# Patient Record
Sex: Male | Born: 1955 | Race: White | Hispanic: No | Marital: Married | Smoking: Current every day smoker
Health system: Southern US, Community
[De-identification: ages and names within clinical notes are randomized; demographics above are authoritative.]

## PROBLEM LIST (undated history)

## (undated) DIAGNOSIS — K219 Gastro-esophageal reflux disease without esophagitis: Secondary | ICD-10-CM

## (undated) DIAGNOSIS — M1990 Unspecified osteoarthritis, unspecified site: Secondary | ICD-10-CM

## (undated) DIAGNOSIS — J449 Chronic obstructive pulmonary disease, unspecified: Secondary | ICD-10-CM

## (undated) DIAGNOSIS — G4733 Obstructive sleep apnea (adult) (pediatric): Secondary | ICD-10-CM

## (undated) DIAGNOSIS — F329 Major depressive disorder, single episode, unspecified: Secondary | ICD-10-CM

## (undated) DIAGNOSIS — I2510 Atherosclerotic heart disease of native coronary artery without angina pectoris: Secondary | ICD-10-CM

## (undated) DIAGNOSIS — M51369 Other intervertebral disc degeneration, lumbar region without mention of lumbar back pain or lower extremity pain: Secondary | ICD-10-CM

## (undated) DIAGNOSIS — M5136 Other intervertebral disc degeneration, lumbar region: Secondary | ICD-10-CM

## (undated) DIAGNOSIS — K635 Polyp of colon: Secondary | ICD-10-CM

## (undated) DIAGNOSIS — I10 Essential (primary) hypertension: Secondary | ICD-10-CM

## (undated) DIAGNOSIS — E785 Hyperlipidemia, unspecified: Secondary | ICD-10-CM

## (undated) DIAGNOSIS — F32A Depression, unspecified: Secondary | ICD-10-CM

## (undated) HISTORY — DX: Depression, unspecified: F32.A

## (undated) HISTORY — DX: Gastro-esophageal reflux disease without esophagitis: K21.9

## (undated) HISTORY — DX: Other intervertebral disc degeneration, lumbar region without mention of lumbar back pain or lower extremity pain: M51.369

## (undated) HISTORY — DX: Chronic obstructive pulmonary disease, unspecified: J44.9

## (undated) HISTORY — DX: Hyperlipidemia, unspecified: E78.5

## (undated) HISTORY — DX: Major depressive disorder, single episode, unspecified: F32.9

## (undated) HISTORY — DX: Other intervertebral disc degeneration, lumbar region: M51.36

## (undated) HISTORY — DX: Atherosclerotic heart disease of native coronary artery without angina pectoris: I25.10

## (undated) HISTORY — DX: Polyp of colon: K63.5

## (undated) HISTORY — DX: Essential (primary) hypertension: I10

## (undated) HISTORY — DX: Obstructive sleep apnea (adult) (pediatric): G47.33

## (undated) HISTORY — DX: Unspecified osteoarthritis, unspecified site: M19.90

---

## 1998-03-12 ENCOUNTER — Ambulatory Visit (HOSPITAL_COMMUNITY): Admission: RE | Admit: 1998-03-12 | Discharge: 1998-03-12 | Payer: Self-pay | Admitting: Neurosurgery

## 2001-04-17 ENCOUNTER — Encounter: Admission: RE | Admit: 2001-04-17 | Discharge: 2001-04-17 | Payer: Self-pay

## 2001-05-26 ENCOUNTER — Encounter: Admission: RE | Admit: 2001-05-26 | Discharge: 2001-06-12 | Payer: Self-pay | Admitting: Neurosurgery

## 2003-04-22 ENCOUNTER — Encounter: Payer: Self-pay | Admitting: Cardiology

## 2003-04-22 ENCOUNTER — Encounter: Admission: RE | Admit: 2003-04-22 | Discharge: 2003-04-22 | Payer: Self-pay | Admitting: Cardiology

## 2003-04-27 ENCOUNTER — Ambulatory Visit (HOSPITAL_COMMUNITY): Admission: RE | Admit: 2003-04-27 | Discharge: 2003-04-28 | Payer: Self-pay | Admitting: Cardiology

## 2003-06-19 ENCOUNTER — Inpatient Hospital Stay (HOSPITAL_COMMUNITY): Admission: EM | Admit: 2003-06-19 | Discharge: 2003-06-21 | Payer: Self-pay

## 2003-06-19 ENCOUNTER — Encounter: Payer: Self-pay | Admitting: *Deleted

## 2003-06-20 ENCOUNTER — Encounter: Payer: Self-pay | Admitting: Cardiology

## 2004-04-05 ENCOUNTER — Ambulatory Visit (HOSPITAL_COMMUNITY): Admission: RE | Admit: 2004-04-05 | Discharge: 2004-04-06 | Payer: Self-pay | Admitting: Orthopedic Surgery

## 2004-09-28 ENCOUNTER — Encounter: Admission: RE | Admit: 2004-09-28 | Discharge: 2004-09-28 | Payer: Self-pay | Admitting: Family Medicine

## 2005-04-18 ENCOUNTER — Ambulatory Visit (HOSPITAL_COMMUNITY): Admission: RE | Admit: 2005-04-18 | Discharge: 2005-04-19 | Payer: Self-pay | Admitting: Orthopedic Surgery

## 2006-05-26 IMAGING — CR DG ABDOMEN ACUTE W/ 1V CHEST
4 series · 4 of 4 positions shown · non-contrast
Comparison: none

CLINICAL DATA: Low abdominal pain, constipation.
DIAGNOSTIC ABDOMEN, ACUTE, WITH CHEST ? 09/28/04:
The bowel gas pattern is normal. There is no evidence of free air, radiopaque calculi or other significant abnormality.  Slight scoliosis, convex to the right, is seen at the superior lumbar spine.  Benign appearing 3 mm prominent pulmonary vessel on end or tiny calcified granuloma seen at the right upper lobe. 

CHEST (ONE VIEW):
The heart size and mediastinal structures are normal. The lungs are clear.

[view not recorded (1 of 4)]
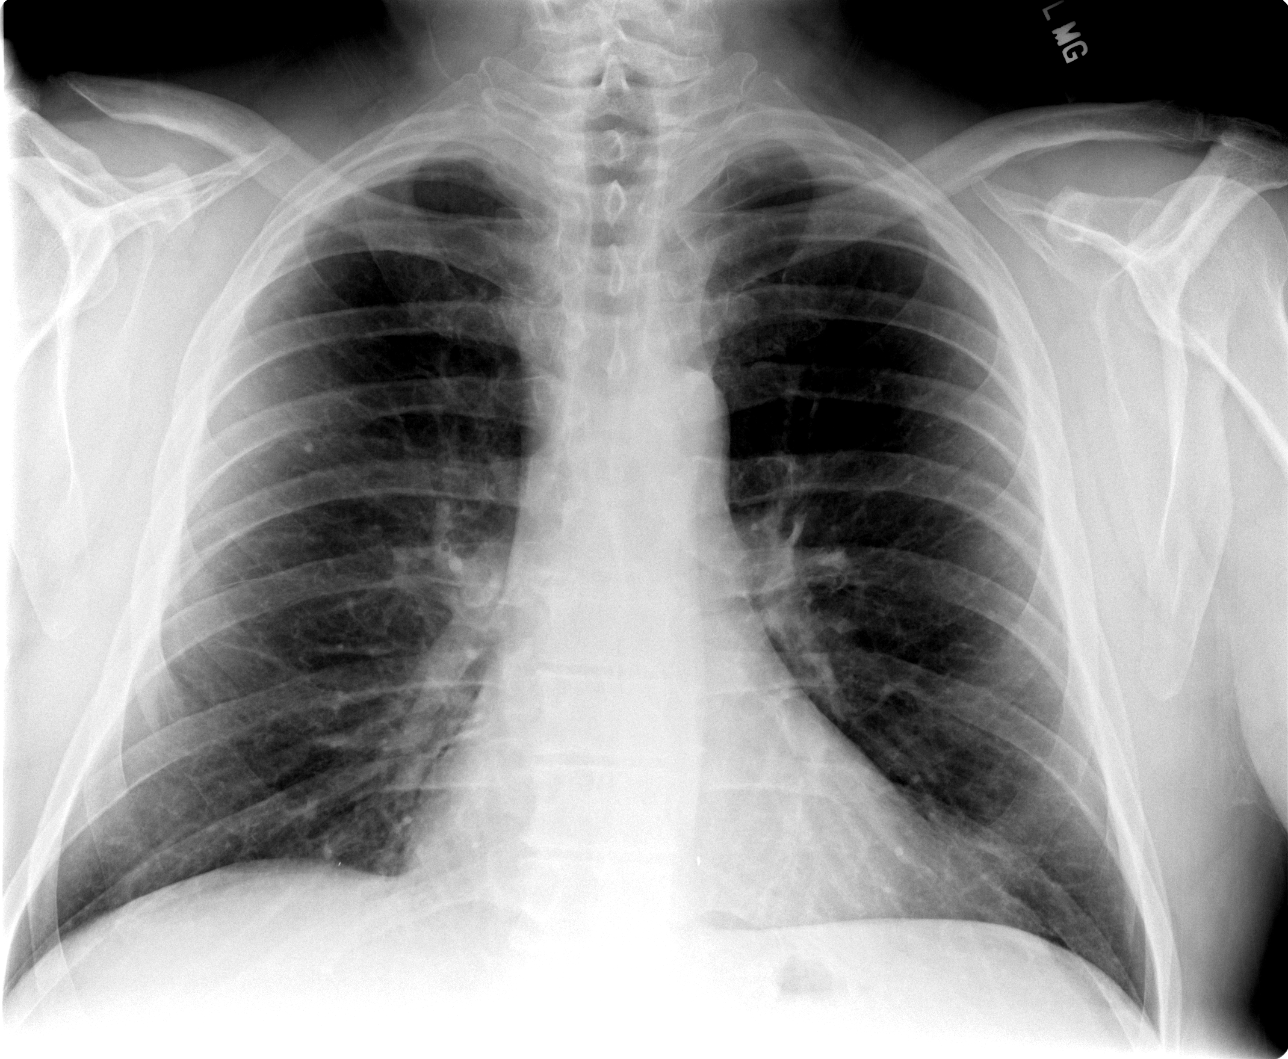

[view not recorded (2 of 4)]
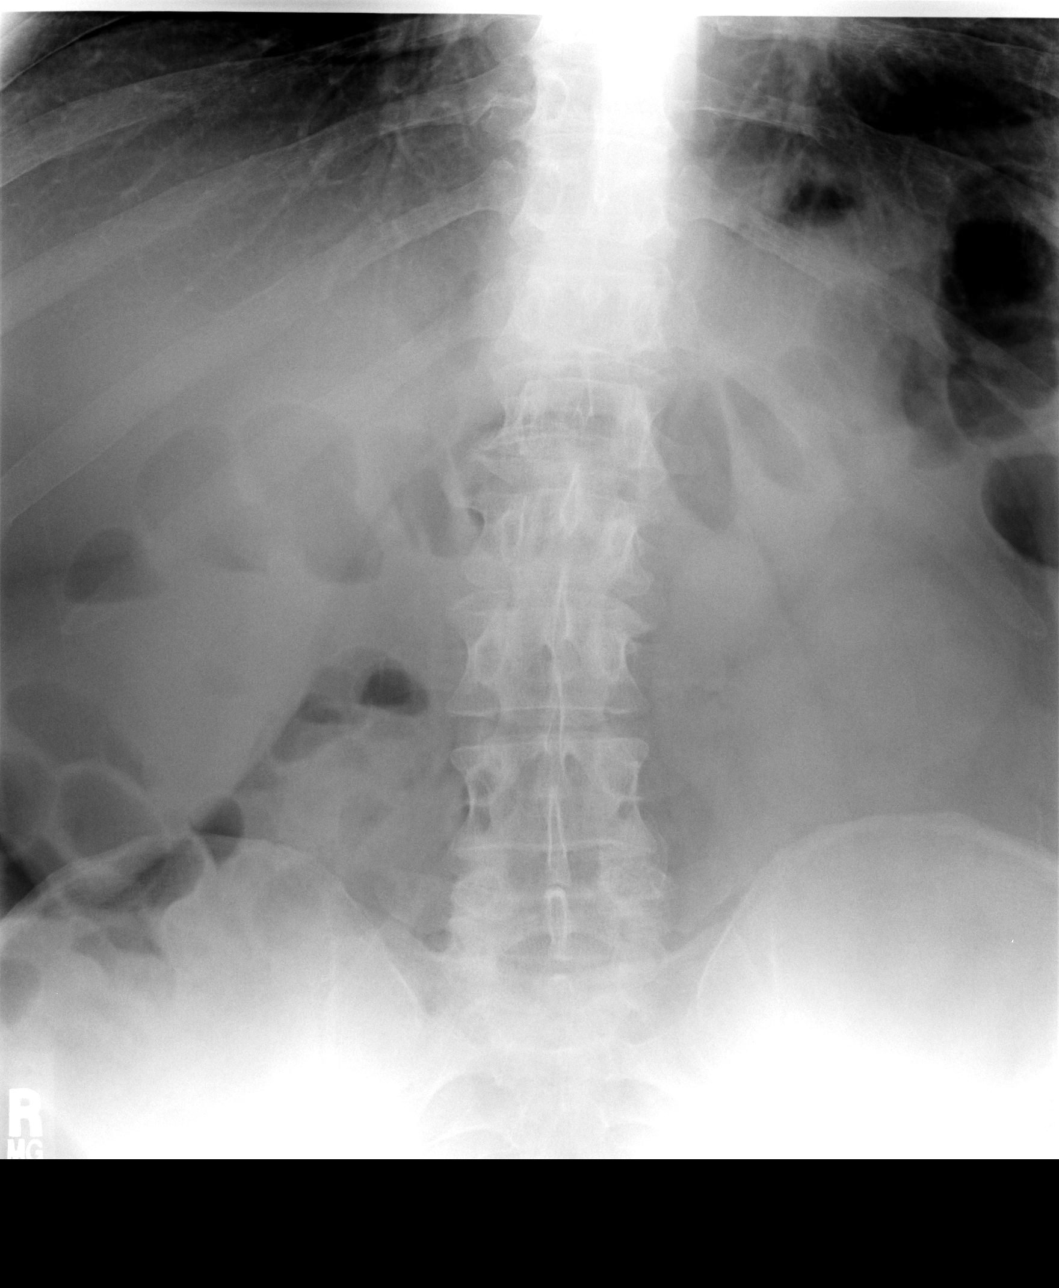

[view not recorded (3 of 4)]
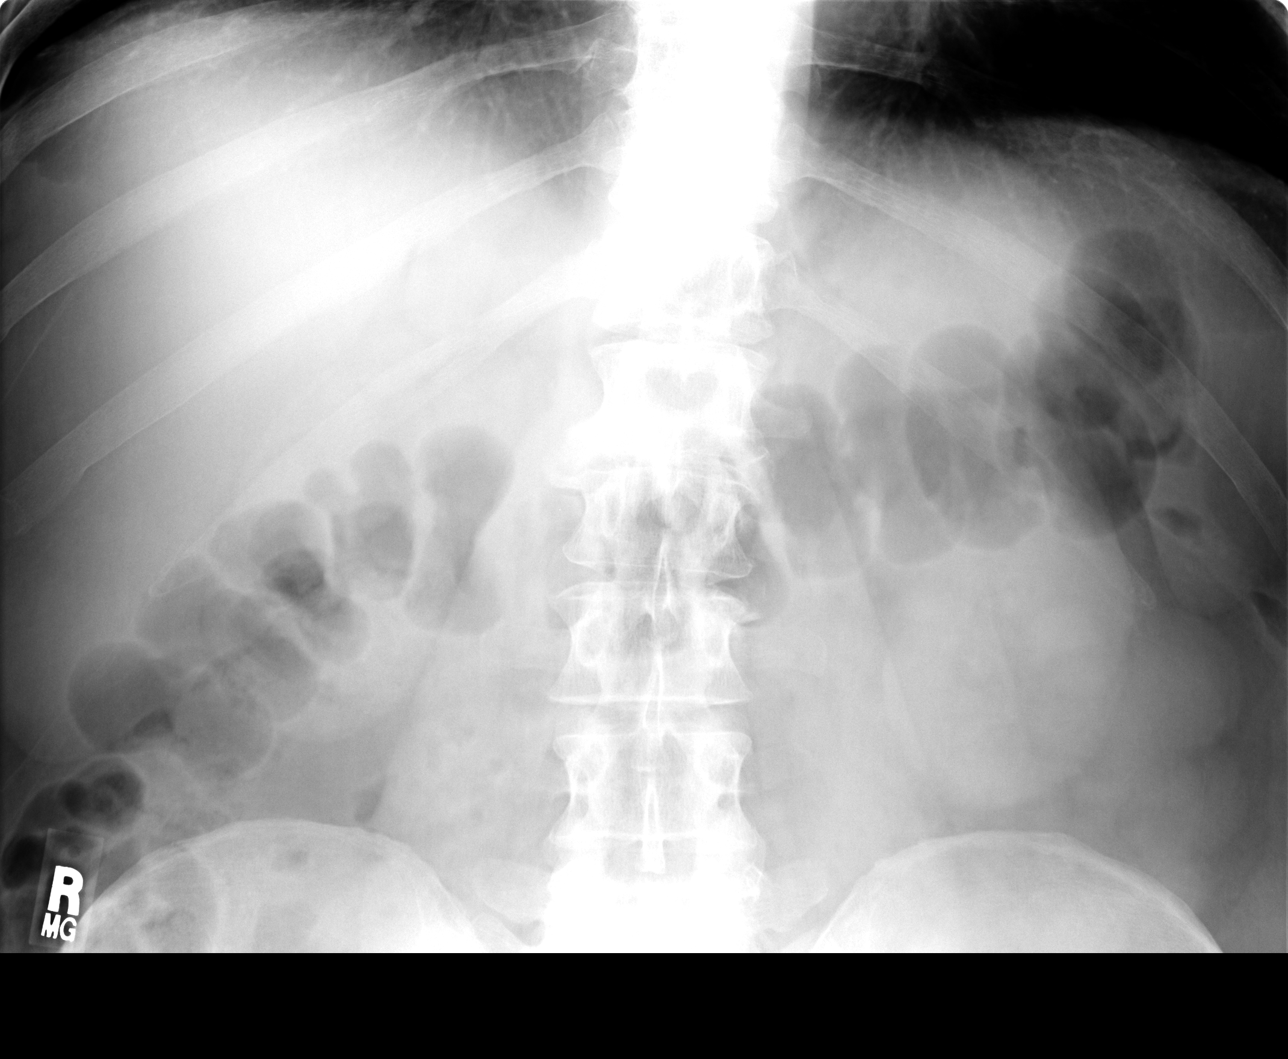

[view not recorded (4 of 4)]
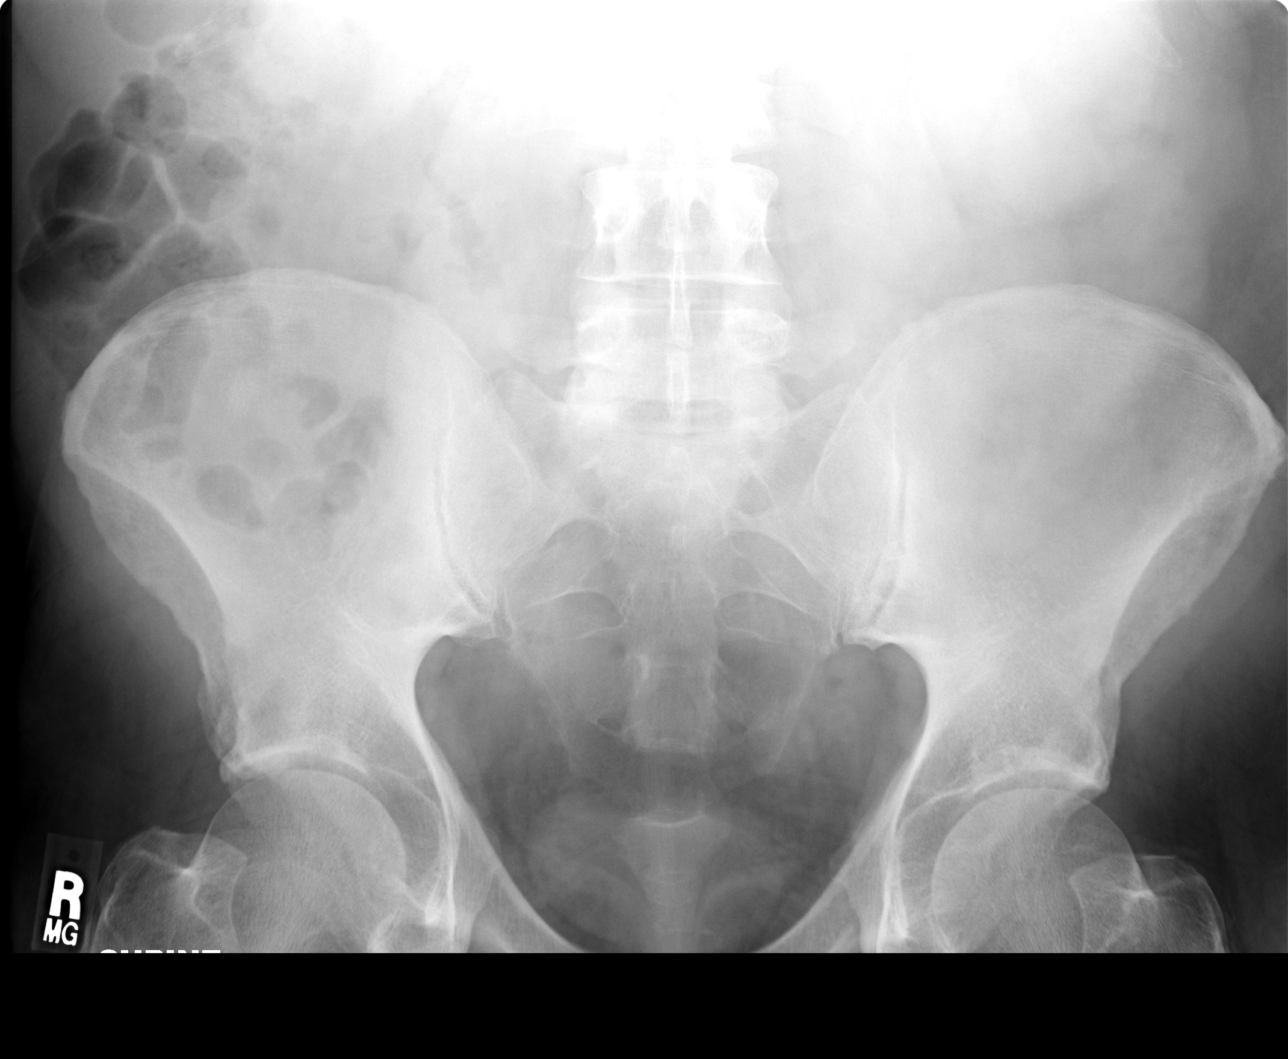

[4 of 4 positions shown; findings below may reference images not displayed]

IMPRESSION: Negative abdominal radiographs. No active cardiopulmonary disease.

## 2007-11-12 ENCOUNTER — Encounter: Payer: Self-pay | Admitting: Pulmonary Disease

## 2007-12-29 ENCOUNTER — Encounter: Payer: Self-pay | Admitting: Pulmonary Disease

## 2008-09-20 ENCOUNTER — Ambulatory Visit (HOSPITAL_COMMUNITY): Admission: RE | Admit: 2008-09-20 | Discharge: 2008-09-20 | Payer: Self-pay | Admitting: General Surgery

## 2010-05-16 ENCOUNTER — Ambulatory Visit: Payer: Self-pay | Admitting: Family Medicine

## 2010-05-16 DIAGNOSIS — E785 Hyperlipidemia, unspecified: Secondary | ICD-10-CM

## 2010-05-16 DIAGNOSIS — F329 Major depressive disorder, single episode, unspecified: Secondary | ICD-10-CM

## 2010-05-16 DIAGNOSIS — G4733 Obstructive sleep apnea (adult) (pediatric): Secondary | ICD-10-CM

## 2010-05-16 DIAGNOSIS — I10 Essential (primary) hypertension: Secondary | ICD-10-CM | POA: Insufficient documentation

## 2010-05-16 DIAGNOSIS — I252 Old myocardial infarction: Secondary | ICD-10-CM

## 2010-06-23 ENCOUNTER — Ambulatory Visit: Payer: Self-pay | Admitting: Family Medicine

## 2010-06-23 ENCOUNTER — Encounter (INDEPENDENT_AMBULATORY_CARE_PROVIDER_SITE_OTHER): Payer: Self-pay | Admitting: *Deleted

## 2010-06-23 DIAGNOSIS — G4730 Sleep apnea, unspecified: Secondary | ICD-10-CM | POA: Insufficient documentation

## 2010-06-23 DIAGNOSIS — D239 Other benign neoplasm of skin, unspecified: Secondary | ICD-10-CM | POA: Insufficient documentation

## 2010-06-23 DIAGNOSIS — Z860100 Personal history of colon polyps, unspecified: Secondary | ICD-10-CM | POA: Insufficient documentation

## 2010-06-23 DIAGNOSIS — F17200 Nicotine dependence, unspecified, uncomplicated: Secondary | ICD-10-CM

## 2010-06-27 LAB — CONVERTED CEMR LAB
AST: 45 units/L — ABNORMAL HIGH (ref 0–37)
Albumin: 4.4 g/dL (ref 3.5–5.2)
Bilirubin, Direct: 0.1 mg/dL (ref 0.0–0.3)
Chloride: 100 meq/L (ref 96–112)
Cholesterol: 283 mg/dL — ABNORMAL HIGH (ref 0–200)
Direct LDL: 147.7 mg/dL
Potassium: 4.4 meq/L (ref 3.5–5.1)
Sodium: 135 meq/L (ref 135–145)
Testosterone: 160.06 ng/dL — ABNORMAL LOW (ref 350.00–890.00)
Total CHOL/HDL Ratio: 7
Total Protein: 7.4 g/dL (ref 6.0–8.3)

## 2010-07-10 ENCOUNTER — Ambulatory Visit: Payer: Self-pay | Admitting: Family Medicine

## 2010-07-10 ENCOUNTER — Ambulatory Visit: Payer: Self-pay | Admitting: Pulmonary Disease

## 2010-07-10 ENCOUNTER — Telehealth (INDEPENDENT_AMBULATORY_CARE_PROVIDER_SITE_OTHER): Payer: Self-pay | Admitting: *Deleted

## 2010-07-10 DIAGNOSIS — E348 Other specified endocrine disorders: Secondary | ICD-10-CM | POA: Insufficient documentation

## 2010-07-11 LAB — CONVERTED CEMR LAB
ALT: 20 units/L (ref 0–53)
Albumin: 4.4 g/dL (ref 3.5–5.2)
Alkaline Phosphatase: 57 units/L (ref 39–117)
Bilirubin, Direct: 0.2 mg/dL (ref 0.0–0.3)
Eosinophils Relative: 1.7 % (ref 0.0–5.0)
HCT: 45.1 % (ref 39.0–52.0)
Hemoglobin: 15.6 g/dL (ref 13.0–17.0)
MCHC: 34.6 g/dL (ref 30.0–36.0)
Neutrophils Relative %: 60.5 % (ref 43.0–77.0)
RDW: 14.6 % (ref 11.5–14.6)
Testosterone: 315.15 ng/dL — ABNORMAL LOW (ref 350.00–890.00)

## 2010-07-17 ENCOUNTER — Encounter (INDEPENDENT_AMBULATORY_CARE_PROVIDER_SITE_OTHER): Payer: Self-pay | Admitting: *Deleted

## 2010-07-19 ENCOUNTER — Encounter: Payer: Self-pay | Admitting: Family Medicine

## 2010-07-20 ENCOUNTER — Ambulatory Visit: Payer: Self-pay | Admitting: Gastroenterology

## 2010-07-27 ENCOUNTER — Ambulatory Visit: Payer: Self-pay | Admitting: Cardiovascular Disease

## 2010-07-27 DIAGNOSIS — R0602 Shortness of breath: Secondary | ICD-10-CM | POA: Insufficient documentation

## 2010-08-07 ENCOUNTER — Telehealth: Payer: Self-pay | Admitting: Pulmonary Disease

## 2010-08-09 ENCOUNTER — Telehealth (INDEPENDENT_AMBULATORY_CARE_PROVIDER_SITE_OTHER): Payer: Self-pay | Admitting: *Deleted

## 2010-08-10 ENCOUNTER — Encounter (HOSPITAL_COMMUNITY): Admission: RE | Admit: 2010-08-10 | Discharge: 2010-08-15 | Payer: Self-pay | Admitting: Cardiovascular Disease

## 2010-08-10 ENCOUNTER — Encounter: Payer: Self-pay | Admitting: Internal Medicine

## 2010-08-10 ENCOUNTER — Ambulatory Visit: Payer: Self-pay

## 2010-08-10 ENCOUNTER — Ambulatory Visit: Payer: Self-pay | Admitting: Internal Medicine

## 2010-08-11 ENCOUNTER — Telehealth (INDEPENDENT_AMBULATORY_CARE_PROVIDER_SITE_OTHER): Payer: Self-pay | Admitting: *Deleted

## 2010-08-15 ENCOUNTER — Telehealth: Payer: Self-pay | Admitting: Family Medicine

## 2010-08-21 ENCOUNTER — Telehealth: Payer: Self-pay | Admitting: Family Medicine

## 2010-08-21 DIAGNOSIS — N39498 Other specified urinary incontinence: Secondary | ICD-10-CM | POA: Insufficient documentation

## 2010-08-25 ENCOUNTER — Encounter: Payer: Self-pay | Admitting: Family Medicine

## 2010-09-07 ENCOUNTER — Telehealth: Payer: Self-pay | Admitting: Cardiovascular Disease

## 2010-09-29 ENCOUNTER — Encounter (INDEPENDENT_AMBULATORY_CARE_PROVIDER_SITE_OTHER): Payer: Self-pay | Admitting: *Deleted

## 2010-10-03 ENCOUNTER — Ambulatory Visit: Payer: Self-pay | Admitting: Family Medicine

## 2010-10-03 LAB — CONVERTED CEMR LAB
ALT: 30 units/L (ref 0–53)
AST: 32 units/L (ref 0–37)
Albumin: 4.1 g/dL (ref 3.5–5.2)
Alkaline Phosphatase: 61 units/L (ref 39–117)
Bilirubin, Direct: 0.1 mg/dL (ref 0.0–0.3)
Cholesterol: 231 mg/dL — ABNORMAL HIGH (ref 0–200)
Direct LDL: 142.4 mg/dL
Total Bilirubin: 0.5 mg/dL (ref 0.3–1.2)
Total CHOL/HDL Ratio: 7
Total Protein: 7 g/dL (ref 6.0–8.3)
VLDL: 93.8 mg/dL — ABNORMAL HIGH (ref 0.0–40.0)

## 2010-10-05 ENCOUNTER — Ambulatory Visit: Payer: Self-pay | Admitting: Family Medicine

## 2010-10-05 DIAGNOSIS — R05 Cough: Secondary | ICD-10-CM

## 2010-10-25 ENCOUNTER — Telehealth: Payer: Self-pay | Admitting: Pulmonary Disease

## 2010-10-30 ENCOUNTER — Ambulatory Visit: Payer: Self-pay | Admitting: Family Medicine

## 2010-10-30 DIAGNOSIS — E291 Testicular hypofunction: Secondary | ICD-10-CM

## 2010-10-31 LAB — CONVERTED CEMR LAB
ALT: 42 units/L (ref 0–53)
BUN: 13 mg/dL (ref 6–23)
Basophils Absolute: 0.1 10*3/uL (ref 0.0–0.1)
Calcium: 9.4 mg/dL (ref 8.4–10.5)
Chloride: 102 meq/L (ref 96–112)
Cholesterol: 221 mg/dL — ABNORMAL HIGH (ref 0–200)
Eosinophils Relative: 3 % (ref 0.0–5.0)
GFR calc non Af Amer: 90.17 mL/min (ref >60.00)
Hemoglobin: 14.6 g/dL (ref 13.0–17.0)
Lymphocytes Relative: 21.1 % (ref 12.0–46.0)
Lymphs Abs: 2 10*3/uL (ref 0.7–4.0)
Monocytes Absolute: 0.7 10*3/uL (ref 0.1–1.0)
Neutro Abs: 6.2 10*3/uL (ref 1.4–7.7)
Sodium: 138 meq/L (ref 135–145)
Total Bilirubin: 0.6 mg/dL (ref 0.3–1.2)
Total Protein: 6.9 g/dL (ref 6.0–8.3)
Triglycerides: 532 mg/dL — ABNORMAL HIGH (ref 0.0–149.0)
WBC: 9.3 10*3/uL (ref 4.5–10.5)

## 2010-11-01 ENCOUNTER — Encounter: Payer: Self-pay | Admitting: Pulmonary Disease

## 2010-11-03 ENCOUNTER — Telehealth: Payer: Self-pay | Admitting: Family Medicine

## 2010-11-07 ENCOUNTER — Ambulatory Visit: Payer: Self-pay | Admitting: Gastroenterology

## 2010-11-07 DIAGNOSIS — I2510 Atherosclerotic heart disease of native coronary artery without angina pectoris: Secondary | ICD-10-CM | POA: Insufficient documentation

## 2010-11-08 ENCOUNTER — Encounter: Payer: Self-pay | Admitting: Pulmonary Disease

## 2010-11-10 ENCOUNTER — Telehealth: Payer: Self-pay | Admitting: Cardiovascular Disease

## 2010-11-13 ENCOUNTER — Telehealth: Payer: Self-pay | Admitting: Gastroenterology

## 2010-11-13 ENCOUNTER — Telehealth: Payer: Self-pay | Admitting: Pulmonary Disease

## 2010-11-17 ENCOUNTER — Ambulatory Visit: Payer: Self-pay | Admitting: Gastroenterology

## 2010-11-22 ENCOUNTER — Encounter: Payer: Self-pay | Admitting: Gastroenterology

## 2010-12-05 ENCOUNTER — Telehealth: Payer: Self-pay | Admitting: Cardiovascular Disease

## 2010-12-21 ENCOUNTER — Ambulatory Visit
Admission: RE | Admit: 2010-12-21 | Discharge: 2010-12-21 | Payer: Self-pay | Source: Home / Self Care | Attending: Family Medicine | Admitting: Family Medicine

## 2010-12-21 DIAGNOSIS — M549 Dorsalgia, unspecified: Secondary | ICD-10-CM | POA: Insufficient documentation

## 2010-12-21 DIAGNOSIS — M5137 Other intervertebral disc degeneration, lumbosacral region: Secondary | ICD-10-CM | POA: Insufficient documentation

## 2010-12-26 ENCOUNTER — Ambulatory Visit: Admit: 2010-12-26 | Payer: Self-pay | Admitting: Family Medicine

## 2010-12-27 ENCOUNTER — Telehealth (INDEPENDENT_AMBULATORY_CARE_PROVIDER_SITE_OTHER): Payer: Self-pay | Admitting: *Deleted

## 2010-12-28 ENCOUNTER — Telehealth: Payer: Self-pay | Admitting: Family Medicine

## 2010-12-28 ENCOUNTER — Telehealth: Payer: Self-pay | Admitting: Gastroenterology

## 2010-12-28 NOTE — Miscellaneous (Signed)
  Clinical Lists Changes Received download from autocpap....good compliance, optimal pressure13-14cm, but had a fairly large leak will see if pt is having mask leak issues, and also discuss comfort of bipap vs cpap with him.    Appended Document:  called pt and got answering machine.  Will call back again tomorrow.

## 2010-12-28 NOTE — Progress Notes (Signed)
Summary: cpap issues  Phone Note Call from Patient Call back at [phone]   Caller: Spouse//candace Call For: clance Summary of Call: States that [facility] brought a cpap machine to her husband last week and it's not working for him, says he can't breathe with it and the machine makes a terrible noise, pls advise. Initial call taken by: Darletta Moll,  August 07, 2010 1:19 PM  Follow-up for Phone Call        pcc, please check into this for me.  He was supposed to get a NEW machine set on auto.  did this occur? Follow-up by: Barbaraann Share MD,  August 07, 2010 4:46 PM  Additional Follow-up for Phone Call Additional follow up Details #1::        spoke to beth @[facility]  she will speak to the wife on tues am and get pt a loaner cpap because he is not eligable for the new machine yet  Additional Follow-up by: Oneita Jolly,  August 07, 2010 4:57 PM

## 2010-12-28 NOTE — Progress Notes (Signed)
Summary: wants thyroid checked  Phone Note Call from Patient   Caller: Significant Other  [name] Summary of Call: Pt's girlfriend is asking if pt can have tsh added to his lab work in january.  She says he is tired all the time and his trigs are way high, and she thinks it might be because his thyroid is off. Initial call taken by: Lowella Petties CMA, AAMA,  November 03, 2010 12:52 PM  Follow-up for Phone Call        yes that is fine. Ruthe Mannan MD  November 03, 2010 2:21 PM   Additional Follow-up for Phone Call Additional follow up Details #1::        TSH added to labs.  Additional Follow-up by: Lowella Petties CMA, AAMA,  November 03, 2010 2:31 PM

## 2010-12-28 NOTE — Progress Notes (Signed)
Summary: possible switch from [facility] to [facility]  Phone Note Call from Patient Call back at [phone]   Caller: Spouse-[name] Call For: Clance Reason for Call: Talk to Nurse Summary of Call: Pt is not happy with [facility].  He wants to switch to [facility] on [facility].  They([facility]) brought him a loner for his cpap machine but no mask or anything to go with it.  [facility], so he would like to switch Initial call taken by: Eugene Gavia,  August 11, 2010 9:53 AM  Follow-up for Phone Call        called spoke with patient's wife, Sonny Masters, who states [facility] will be by their house this afternoon between 1 and 3pm and is supposed to be bringing pt a new mask and hose, as he has been using the same ones since 2007.  [name] states that they will "wait and see" what happens this afternoon with [facility] before making the final decision about whether or not to switch DME's.  asked [name] to keep up informed; she stated that she will.  will sign off on message and await a call back. Follow-up by: Boone Master CNA/MA,  August 11, 2010 11:03 AM

## 2010-12-28 NOTE — Assessment & Plan Note (Signed)
Summary: np3/cad/smoker/jml   Referring Provider:  Ruthe Mannan Primary Provider:  Ruthe Mannan MD  CC:  pt didnot bring meds pt states our  list should be up to date...referal from Dr. Dayton Martes.  History of Present Illness: Overweight smoker with CAD.  Previously followed by Dr Clarene Duke.  History of stent to OM.  Last cath a year ago in [place].  No records but apparantly stent patent and had "collateral " flow to another vessel with medical Rx recommended.  He smokes 2-3 ppd.  Sees Dr Shelle Iron for COPD.  Had nightmares with Chantix.  Counseled for less than 10 minutes about smoking cessation.  Will try to get him into program with Dr Dellia Cloud.  Ok to use nicotine patch if myovue normal.  Denies "heat" which was previous antinal equivalent but scared to exercise.  Has lots of exertional dyspnea.  No cough or sputum.  Chronic bronchitic voice.  Compliant with meds.  May benefit from Plavix given ongoing smoking.    Current Problems (verified): 1)  Shortness of Breath  (ICD-786.05) 2)  Hypertension  (ICD-401.9) 3)  Hyperlipidemia  (ICD-272.4) 4)  Myocardial Infarction, Hx of  (ICD-412) 5)  Other Specified Endocrine Disorders  (ICD-259.8) 6)  Tobacco Abuse  (ICD-305.1) 7)  Colonic Polyps, Hx of  (ICD-V12.72) 8)  Nevus  (ICD-216.9) 9)  Sleep Apnea  (ICD-780.57) 10)  Obstructive Sleep Apnea  (ICD-327.23) 11)  Depression  (ICD-311)  Current Medications (verified): 1)  Amlodipine Besylate 5 Mg Tabs (Amlodipine Besylate) .... Take One Tablet By Mouth Daily 2)  Plavix 75 Mg Tabs (Clopidogrel Bisulfate) .... Take One Tablet By Mouth Daily 3)  Ranexa 500 Mg Xr12h-Tab (Ranolazine) .Marland Kitchen.. 1 By Mouth Two Times A Day 4)  Lexapro 20 Mg Tabs (Escitalopram Oxalate) .... Take One Tablet By Mouth Daily 5)  Aspirin 325 Mg Tabs (Aspirin) .... Take One Tablet By Mouth Daily 6)  Fexofenadine Hcl 180 Mg Tabs (Fexofenadine Hcl) .... Take One Tablet By Mouth Daily 7)  Metoprolol Tartrate 25 Mg Tabs (Metoprolol Tartrate)  .... Take One Tablet By Mouth Two Times A Day 8)  Simcor 500-40 Mg Xr24h-Tab (Niacin-Simvastatin) .... Take One Tablet By Mouth Daily 9)  Zyban 150 Mg Xr12h-Tab (Bupropion Hcl (Smoking Deter)) .Marland Kitchen.. 1 Tab Po Daily X 3 Days; Increase To 1 Tab Po Twice Daily; Treatment Should Continue For 7-12 Weeks. 10)  Tricor 145 Mg Tabs (Fenofibrate) .Marland Kitchen.. 1 Tab By Mouth Daily. 11)  Androgel Pump 1 % Gel (Testosterone) .... 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  started at age 46. Alcohol use-yes 10 drinks/week Drug use-no Regular exercise-yes pt is single. pt has children.  Review of Systems       Denies fever, malais, weight loss, blurry vision, decreased visual acuity, cough, sputum, hemoptysis, pleuritic pain, palpitaitons, heartburn, abdominal pain, melena, lower extremity edema, claudication, or rash.   Vital Signs:  Patient profile:   55 year old male Height:      73 inches Weight:  301 pounds BMI:     39.86 Pulse rate:   68 / minute Resp:     14 per minute BP sitting:   137 / 85  (left arm)  Vitals Entered By: Kem Parkinson (July 27, 2010 1:36 PM)  Physical Exam  General:  Affect appropriate Healthy:  appears stated age HEENT: normal Neck supple with no adenopathy JVP normal no bruits no thyromegaly Lungs clear with no wheezing and good diaphragmatic motion Heart:  S1/S2 no murmur,rub, gallop  or click PMI normal Abdomen: benighn, BS positve, no tenderness, no AAA no bruit.  No HSM or HJR Distal pulses intact with no bruits No edema Neuro non-focal Skin warm and dry    Impression & Recommendations:  Problem # 1:  SHORTNESS OF BREATH (ICD-786.05) F/U Dr Shelle Iron.  R/O coronary componant with stress myovue.  Check Oxygen sats with exercise  Continue inhalers His updated medication list for this problem includes:    Amlodipine Besylate 5 Mg Tabs (Amlodipine besylate) .Marland Kitchen... Take one tablet by mouth daily    Aspirin 325 Mg Tabs (Aspirin) .Marland Kitchen... Take one tablet by mouth daily    Metoprolol Tartrate 25 Mg Tabs (Metoprolol tartrate) .Marland Kitchen... Take one tablet by mouth two times a day  Orders: Nuclear Stress Test (Nuc Stress Test)  Problem # 2:  HYPERTENSION (ICD-401.9) Well controlled His updated medication list for this problem includes:    Amlodipine Besylate 5 Mg Tabs (Amlodipine besylate) .Marland Kitchen... Take one tablet by mouth daily    Aspirin 325 Mg Tabs (Aspirin) .Marland Kitchen... Take one tablet by mouth daily    Metoprolol Tartrate 25 Mg Tabs (Metoprolol tartrate) .Marland Kitchen... Take one tablet by mouth two times a day  Orders: Nuclear Stress Test (Nuc Stress Test)  Problem # 3:  HYPERLIPIDEMIA (ICD-272.4) Continue 3 drug Rx Refer to dietician.  Diet is poor.  Low carb [facility] type meal plan discussed His updated medication list for this problem includes:    Simcor 500-40 Mg Xr24h-tab (Niacin-simvastatin) .Marland Kitchen... Take one tablet by mouth daily    Tricor 145 Mg Tabs (Fenofibrate) .Marland Kitchen... 1 tab by mouth daily.  Orders: Nuclear Stress Test (Nuc Stress Test)  CHOL: 283 (06/23/2010)   HDL: 41.50 (06/23/2010)   TG: 592.0 (06/23/2010)  Problem # 4:  TOBACCO ABUSE (ICD-305.1) Counseled.  Will advice F/u with [facility] and nicotine replacement if myovue non ischemic.    Patient Instructions: 1)  Your physician recommends that you schedule a follow-up appointment in: 6 months 2)  You have been referred  to dr guttermqn--d/c smoking 3)  Your physician has requested that you have an exercise stress myoview.  For further information please visit [email or web address]  Please follow instruction sheet, as given.

## 2010-12-28 NOTE — Assessment & Plan Note (Signed)
Summary: Cardiology Nuclear Testing  Nuclear Med Background Indications for Stress Test: Evaluation for Ischemia, Stent Patency   History: Angioplasty, Asthma, COPD, Heart Catheterization, Myocardial Infarction, Myocardial Perfusion Study, Stents  History Comments: 04 UEA:VWUJ inferior ischemia,  EF=58%  Symptoms: Chest Pain, Chest Pain with Exertion, DOE, Rapid HR  Symptoms Comments: Last episode of CP:6 weeks ago.   Nuclear Pre-Procedure Cardiac Risk Factors: Hypertension, Lipids, Obesity, Smoker Caffeine/Decaff Intake: None NPO After: 7:00 PM Lungs: Clear.  O2 Sat 98% on RA. IV 0.9% NS with Angio Cath: 22g     IV Site: R Hand IV Started by: Bonnita Levan, RN Chest Size (in) 54     Height (in): 77 Weight (lb): 288 BMI: 34.28 Tech Comments: Metoprolol not held per MD  Nuclear Med Study 1 or 2 day study:  1 day     Stress Test Type:  Treadmill/Lexiscan Reading MD:  Arvilla Meres, MD     Referring MD:  Charlton Haws, MD Resting Radionuclide:  Technetium 41m Tetrofosmin     Resting Radionuclide Dose:  11.0 mCi  Stress Radionuclide:  Technetium 38m Tetrofosmin     Stress Radionuclide Dose:  33.0 mCi   Stress Protocol Exercise Time (min):  8:01 min     Max HR:  117 bpm     Predicted Max HR:  167 bpm  Max Systolic BP: 187 mm Hg     Percent Max HR:  70.06 %     METS: 7.9 Rate Pressure Product:  81191  Lexiscan: 0.4 mg   Stress Test Technologist:  [employer], CMA-N     Nuclear Technologist:  Domenic Polite, CNMT  Rest Procedure  Myocardial perfusion imaging was performed at rest 45 minutes following the intravenous administration of Technetium 55m Tetrofosmin.  Stress Procedure  Patient initially walked the treadmill utilizing the Bruce protocol for 7:04, but was unable to achieve his target heart rate due to significant dyspnea with an O2 Sat of 96% at peak exercise.  He then received IV Lexiscan 0.4 mg over 15-seconds and then Technetium 32m Tetrofosmin was injected at  30-seconds.  There were no significant ST-T wave changes with Lexiscan, only rare PAC's.  He did c/o his chest "burning" at peak exercise with dyspnea, that resolved immediately with rest.  Quantitative spect images were obtained after a 45 minute delay.  QPS Raw Data Images:  Extensive soft tissue attenuation. Stress Images:  There is decreased uptake in the inferior wall Rest Images:  There is decreased uptake in the inferior wall. Subtraction (SDS):  There is a fixed defect that is most consistent with a previous infarction. No significant ischemia. Transient Ischemic Dilatation:  .96  (Normal <1.22)  Lung/Heart Ratio:  .34  (Normal <0.45)  Quantitative Gated Spect Images QGS EDV:  150 ml QGS ESV:  68 ml QGS EF:  55 % QGS cine images:  Normal  Findings Low risk nuclear study      Overall Impression  Exercise Capacity: Fair exercise capacity. BP Response: Normal blood pressure response. Clinical Symptoms: Dyspnea and chest burning. Sats stayed 96% with exercise. ECG Impression: No significant ST segment change suggestive of ischemia. At submaximal HR ( 70% of age-predicted max) Overall Impression: Low risk stress nuclear study. Overall Impression Comments: There is a fixed defect in the inferior wall on perfusion imaging suggestive of previous infarct.  However, given normal wall motion and extensive soft-tissue artifact on raw images this may be artifactual. No ischemia.   Appended Document: Cardiology Nuclear Testing no ishemia good  EF  Continue medical Rx  Appended Document: Cardiology Nuclear Testing pt aware of results

## 2010-12-28 NOTE — Assessment & Plan Note (Signed)
Summary: MEDICATION REFILL/EVM   Vital Signs:  Patient Profile:   55 Years Old Male CC:      Medication renewal and Referral to Primary care. / rwt Height:     73.5 inches Weight:      295 pounds BMI:     38.53 O2 Sat:      97 % O2 treatment:    Room Air Temp:     98.3 degrees F oral Pulse rate:   70 / minute Pulse rhythm:   regular Resp:     18 per minute BP sitting:   134 / 78  (left arm)  Pt. in pain?   no  Vitals Entered By: Levonne Spiller EMT-P (May 16, 2010 1:36 PM)              Is Patient Diabetic? No Comments Pt. is a smoker. 2 packs per day.      Current Allergies: No known allergies History of Present Illness History from: patient Reason for visit: see chief complaint Chief Complaint: Medication renewal and Referral to Primary care. / rwt History of Present Illness: Patient reports that he has made an appointment to see a primary care physician but his appointment isn't until July. He needs a refill of Ranexa. The other meds he has refills of. He has a history of HTN, sleep apnea,Hypercholesterolism, CAD s/p MI and stent placement.   He reports that he needs another CPAP as he feels that his is broken. Unsure of his last sleep study.  REVIEW OF SYSTEMS Constitutional Symptoms       Complains of weight loss.     Denies fever, chills, night sweats, weight gain, and fatigue.      Comments: recently loss 20lbs on purpose Eyes       Denies change in vision, eye pain, eye discharge, glasses, contact lenses, and eye surgery. Ear/Nose/Throat/Mouth       Denies hearing loss/aids, change in hearing, ear pain, ear discharge, dizziness, frequent runny nose, frequent nose bleeds, sinus problems, sore throat, hoarseness, and tooth pain or bleeding.  Respiratory       Denies dry cough, productive cough, wheezing, shortness of breath, asthma, bronchitis, and emphysema/COPD.  Cardiovascular       Denies murmurs, chest pain, and tires easily with exhertion.     Gastrointestinal       Complains of indigestion.      Denies stomach pain, nausea/vomiting, diarrhea, constipation, and blood in bowel movements. Genitourniary       Denies painful urination, kidney stones, and loss of urinary control. Neurological       Denies paralysis, seizures, and fainting/blackouts. Musculoskeletal       Denies muscle pain, joint pain, joint stiffness, decreased range of motion, redness, swelling, muscle weakness, and gout.  Skin       Denies bruising, unusual mles/lumps or sores, and hair/skin or nail changes.  Psych       Denies mood changes, temper/anger issues, anxiety/stress, speech problems, depression, and sleep problems.  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: 2 ppd x 38 yrs Alcohol use-yes 10 drinks/week Drug use-no Regular exercise-yes Smoking Status:  current Drug Use:  no Does Patient Exercise:  yes Physical Exam General appearance: obese, well developed, well nourished, no acute distress Chest/Lungs: no rales, wheezes, or rhonchi bilateral, breath sounds  equal without effort Heart: regular rate and  rhythm, no murmur Extremities: normal extremities - no edema MSE: oriented to time, place, and person Assessment New Problems: OBSTRUCTIVE SLEEP APNEA (ICD-327.23) MYOCARDIAL INFARCTION, HX OF (ICD-412) HYPERTENSION (ICD-401.9) HYPERLIPIDEMIA (ICD-272.4) DEPRESSION (ICD-311)   Plan New Medications/Changes: RANEXA 500 MG XR12H-TAB (RANOLAZINE) 1 by mouth two times a day  #60 x 1, 05/16/2010, Tacey Ruiz MD  New Orders: New Patient Level II [phone] Sleep Study [Sleep Study]  The patient and/or caregiver has been counseled thoroughly with regard to medications prescribed including dosage, schedule,  interactions, rationale for use, and possible side effects and they verbalize understanding.  Diagnoses and expected course of recovery discussed and will return if not improved as expected or if the condition worsens. Patient and/or caregiver verbalized understanding.  Prescriptions: RANEXA 500 MG XR12H-TAB (RANOLAZINE) 1 by mouth two times a day  #60 x 1   Entered and Authorized by:   Tacey Ruiz MD   Signed by:   Tacey Ruiz MD on 05/16/2010   Method used:   Electronically to        [facility] (retail)       [address], [address]       [place], [place]  [address]       Ph: [phone]       Fax: [phone]   RxID:   [id number]   Orders Added: 1)  New Patient Level II [99202] 2)  Sleep Study [Sleep Study]  The risks, benefits and possible side effects of the treatments and tests were explained clearly to the patient and the patient verbalized understanding.  The patient was informed that there is no on-call provider or services available at this clinic during off-hours (when the clinic is closed).  If the patient developed a problem or concern that required immediate attention, the patient was advised to go the the nearest available urgent care or emergency department for medical care.  The patient verbalized understanding.

## 2010-12-28 NOTE — Progress Notes (Signed)
Summary: CPAP w/ download ordered thru [facility]  Phone Note Call from Patient   Caller: Patient Call For: clance Summary of Call: need to speak to nurse about v pap machine. Initial call taken by: Rickard Patience,  July 10, 2010 3:01 PM  Follow-up for Phone Call        [facility] pt's home number.  Number did not ring, went directly to voicemail - [facility] Gweneth Dimitri RN  July 10, 2010 3:14 PM  Spoke with pt.  Pt states he has a V pap auto 25 [employer].  It is thru [facility].  He would like to know if [facility] has received his sleep study yet.  Will forward message to Dr. Shelle Iron.  Pls advise. Thanks! Follow-up by: Gweneth Dimitri RN,  July 12, 2010 12:14 PM  Additional Follow-up for Phone Call Additional follow up Details #1::        let pt know that I will send order to [facility].  Will start him on automatic mode for 2 weeks to re-optimize his pressure.  I will call him with results, then can set on his optimal pressure. Additional Follow-up by: Barbaraann Share MD,  July 12, 2010 5:04 PM    Additional Follow-up for Phone Call Additional follow up Details #2::    called spoke with patient, advised of KC's recs as stated above.  pt verbalized his understanding. Boone Master CNA/MA  July 12, 2010 5:20 PM

## 2010-12-28 NOTE — Progress Notes (Signed)
Summary: coumadin instructions  Phone Note Call from Patient   Caller: [phone] [name] Call For: Dr Russella Dar Summary of Call: Needs to know about stopping his coumadin. Initial call taken by: Leanor Kail [facility],  November 13, 2010 9:57 AM  Follow-up for Phone Call        Spoke with patients Domestic Partner and told her that patient can stop Plavix 5 days before his procedure. [name] verbalized understanding and will notify patient to stop today for Colonoscopy . Follow-up by: Christie Nottingham CMA Duncan Dull),  November 13, 2010 10:24 AM

## 2010-12-28 NOTE — Letter (Signed)
Summary: Anticoagulation Modification Letter  [facility]  [address] [place], [place] [address]   Phone: [phone]  Fax: [phone]    November 07, 2010  Re:    Bobby Pena DOB:    1955-12-28 MRN:    [id number]    Dear Dr. Dayton Martes,  We have scheduled the above patient for an endoscopic procedure. Our records show that  he/she is on anticoagulation therapy. Please advise as to how long the patient may come off their therapy of Plavix prior to the scheduled procedure(s) on 11/17/10.   Please fax back/or route the completed form to [place] at [phone].  Thank you for your help with this matter.  Sincerely,  Christie Nottingham CMA Duncan Dull)   Physician Recommendation:  Hold Plavix 7 days prior ________________  Hold Coumadin 5 days prior ____________  Other ______________________________     Appended Document: Anticoagulation Modification Letter Pt is followed by cardiology so you may want to verify with them. Typically stop Plavix 5 days prior to procedure.  Appended Document: Anticoagulation Modification Letter Faxed appended note from Dr. Dayton Martes to [place] at [phone].

## 2010-12-28 NOTE — Progress Notes (Signed)
Summary: returning call  Phone Note Call from Patient Call back at Home Phone [phone]   Caller: Patient Call For: clance Reason for Call: Talk to Nurse Summary of Call: Returning Dr. Teddy Spike call about cpap. Initial call taken by: Lehman Prom,  November 13, 2010 10:43 AM  Follow-up for Phone Call        talked with pt about recent autodownload. he is having a lot of issues with mask leak, and also tells me he did better with bilevel.  will get him a bipap auto/set pressure, reoptimize his pressure for 2 weeks, then set and be done with it.  Will also set up for fitting at sleep center for new mask Follow-up by: Barbaraann Share MD,  November 13, 2010 4:51 PM

## 2010-12-28 NOTE — Assessment & Plan Note (Signed)
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  Last four disks - significant degenerative disk disease with significant degnerative spurs.   Has been fighting -- has been laid out on ice and heat over the last few days.   Left medial thigh.   Back Pain History:      The patient's back pain started approximately 12/16/2010.  The pain is located in the lower back region and does radiate below the knees.  He states this is work related.  On a scale of 1-10, he describes the pain as a 10.        Description of injury in patient's own words:  the patient has had chronic intermittent back pain  for the last 15 years. He has severe degenerative disc disease, severe osteophytosis, severe arthritic changes in his back, and he is been told on multiple occasions in the nasal large-scale back operation including a large fusion. He has been laying on the floor for about the last 4 days. In general, he is been able to tolerate this nonoperatively with some intermittent muscle relaxant use, pain medications, and  chiropractic manipulation.  The he will have flares 2-3 times annually.    Critical Exclusionary Diagnosis Criteria (CEDC) for Back Pain:      The patient denies a history of previous trauma.  He has no prior history of spinal surgery.  There are no symptoms to suggest infection.  Symptoms to suggest the possibility of cauda equina syndrome include sensory deficit of lower extremity.  Possible psychosocial cause(s) for his back pain include failure of previous therapy.  Other  positive CEDC factors include low back pain worse with activity and low back pain worse with lumbar extension (downhill walking-standing-reaching overhead).    Allergies: 1)  ! Chantix  Past History:  Past medical, surgical, family and social histories (including risk factors) reviewed, and no changes noted (except as noted below).  Past Medical History: Reviewed history from 11/07/2010 and no changes required. HYPERTENSION HYPERLIPIDEMIA  MYOCARDIAL INFARCTION, HX OF  OTHER SPECIFIED ENDOCRINE DISORDERS TOBACCO ABUSE  COLONIC POLYPS, HX OF  NEVUS SLEEP APNEA  OBSTRUCTIVE SLEEP APNEA  DEPRESSION Arthritis Asthma Congestive Heart Failure  Past Surgical History: Reviewed history from 11/02/2010 and no changes required. PTCA/stent   2004 tonsillectomy nasal surgery B shoulder surgeries B knee surgeries ankle surgery Umbilical hernia repair  Family History: Reviewed history from 11/07/2010 and no changes required. Mother: D - cancer  Lung Uncle - MI at 48 No FH of Colon Cancer:  Social History: Reviewed history from 07/10/2010 and no changes required. 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  started at age 47. Alcohol use-yes 10 drinks/week Drug use-no Regular exercise-yes pt is single. pt has children.  Review of Systems       REVIEW OF SYSTEMS  GEN: No systemic complaints, no fevers, chills, sweats, or other acute illnesses  MSK: Detailed in the HPI GI: tolerating PO intake without difficulty Neuro:as above Otherwise the pertinent positives of the ROS are noted above.    Physical Exam  General:  GEN: Well-developed,well-nourished,in no acute distress; alert,appropriate and cooperative throughout examination HEENT: Normocephalic and atraumatic without obvious abnormalities. No apparent alopecia or balding. Ears, externally no deformities PULM: Breathing comfortably in no respiratory distress EXT: No clubbing, cyanosis, or  edema PSYCH: Normally interactive. Cooperative during the interview. Pleasant. Friendly and conversant. Not anxious or depressed appearing. Normal, full affect.  Msk:  Normal Greater trochanteric bursae Sciatic Notches: TTP Sensation to Gross touch : decreased sensation L medial thigh DTR 2+ knee and ankle no clonus DP and PT pulses are normal B   Low Back Pain Physical Exam:    Inspection-deformity:     Yes       Description:   minimal bending, TTP diffusely LB    Palpation-spinal tenderness:   Yes       Location:   L4-L5    Motor Exam/Strength:         Left Ankle Dorsiflexion (L5,L4):     normal       Left Great Toe Dorsiflexion (L5,L4):     normal       Left Heel Walk (L5,some L4):     normal       Left Single Squat & Rise-Quads (L4):   normal       Left Toe Walk-calf (S1):       normal       Right Ankle Dorsiflexion (L5,L4):     normal       Right Great Toe Dorsiflexion (L5,L4):       normal       Right Heel Walk (L5,some L4):     normal       Right Single Squat & Rise Quads (L4):   abnormal       Right Toe Walk-calf (S1):       normal    Sensory Exam/Pinprick:        Left Medial Foot (L4):   normal       Left Dorsal Foot (L5):   normal       Left Lateral Foot (S1):   normal       Right Medial Foot (L4):   normal       Right Dorsal Foot (L5):   normal       Right Lateral Foot (S1):   normal       Sensory--Other:     decreased sensation medial left thigh    Reflexes:        Left Knee Jerk (L4):     normal       Left Ankle Reflex (S1):   normal       Right Knee Jerk:     normal       Right Ankle Reflex (S1):   normal    Straight Leg Raise (SLR):       Left Straight Leg Raise (SLR):   positive       Right Straight Leg Raise (SLR):   positive   Impression & Recommendations:  Problem # 1:  DEGENERATIVE DISC DISEASE, LUMBAR SPINE (ICD-722.52) Assessment New acute on chronic severe back pain.  Long-standing, 15 years. Expect the patient will have intermittent flares  lifelong. known severe degenerative disc disease, and severe arthropathy of the spine. hher also neuropathic changes including LEFT medial thigh decreased sensation and weakness  particularly of  hip  flexion on the RIGHT. The patient tells me that these are not new symptoms, and these have been long standing with prior flares.  He is also on Plavix, which limits some of his medications. Vicodin p.r.n.  Flexeril up to t.i.d. p.r.n. [Manipulation is reasonable  for this case, and has been helpful  in the past.  No bowel or bladder incontinence. He does understand that he does have symptoms and signs  that many would attribute to a surgical spine, but does not wish to pursue this avenue.  Problem # 2:  BACK PAIN (ICD-724.5) Assessment: New  His updated medication list for this problem includes:    Aspirin 325 Mg Tabs (Aspirin) .Marland Kitchen... Take one tablet by mouth daily    Hydrocodone-acetaminophen 5-500 Mg Tabs (Hydrocodone-acetaminophen) .Marland Kitchen... 1 - 2 by mouth q 6 hours as needed pain    Cyclobenzaprine Hcl 10 Mg Tabs (Cyclobenzaprine hcl) .Marland Kitchen... 1 by mouth 3  times daily as needed for back pain  Complete Medication List: 1)  Amlodipine Besylate 5 Mg Tabs (Amlodipine besylate) .... Take one tablet by mouth daily 2)  Plavix 75 Mg Tabs (Clopidogrel bisulfate) .... Take one tablet by mouth daily 3)  Ranexa 500 Mg Xr12h-tab (Ranolazine) .Marland Kitchen.. 1 by mouth two times a day 4)  Lexapro 20 Mg Tabs (Escitalopram oxalate) .... Take one tablet by mouth daily 5)  Aspirin 325 Mg Tabs (Aspirin) .... Take one tablet by mouth daily 6)  Fexofenadine Hcl 180 Mg Tabs (Fexofenadine hcl) .... Take one tablet by mouth daily 7)  Metoprolol Tartrate 25 Mg Tabs (Metoprolol tartrate) .... Take one tablet by mouth once daily 8)  Zyban 150 Mg Xr12h-tab (Bupropion hcl (smoking deter)) .Marland Kitchen.. 1 tab po daily x 3 days; increase to 1 tab po twice daily; treatment should continue for 7-12 weeks. 9)  Androgel Pump 1 % Gel (Testosterone)  .... 5 g applied once daily (preferably in the morning) to the shoulder and upper arms 10)  Singulair 10 Mg Tabs (Montelukast sodium) .Marland Kitchen.. 1 by mouth daily 11)  Crestor 10 Mg Tabs (Rosuvastatin calcium) .Marland Kitchen.. 1 tablet by mouth daily 12)  Gemfibrozil 600 Mg Tabs (Gemfibrozil) .Marland Kitchen.. 1 tab by mouth two times a day - 30 minutes prior to breakfast and dinner. 13)  Hydrocodone-acetaminophen 5-500 Mg Tabs (Hydrocodone-acetaminophen) .Marland Kitchen.. 1 - 2 by mouth q 6 hours as needed pain 14)  Cyclobenzaprine Hcl 10 Mg Tabs (Cyclobenzaprine hcl) .Marland Kitchen.. 1 by mouth 3  times daily as needed for back pain Prescriptions: CYCLOBENZAPRINE HCL 10 MG  TABS (CYCLOBENZAPRINE HCL) 1 by mouth 3  times daily as needed for back pain  #50 x 0   Entered and Authorized by:   Hannah Beat MD   Signed by:   Hannah Beat MD on 12/21/2010   Method used:   Print then Give to Patient   RxID:   [id number] HYDROCODONE-ACETAMINOPHEN 5-500 MG TABS (HYDROCODONE-ACETAMINOPHEN) 1 - 2 by mouth q 6 hours as needed pain  #50 x 0   Entered and Authorized by:   Hannah Beat MD   Signed by:   Hannah Beat MD on 12/21/2010   Method used:   Print then Give to Patient   RxID:   [id number]    Orders Added: 1)  Est. Patient Level IV [41324]    Current Allergies (reviewed today): ! CHANTIX

## 2010-12-28 NOTE — Letter (Signed)
Summary: [facility] Instructions  [facility]  [address] [place], [place] [address]   Phone: [phone]  Fax: [phone]       KRISTINE TILEY    02/15/1956    MRN: [id number]        Procedure Day /Date: Friday December 23rd, 2011     Arrival Time: 10:30am     Procedure Time: 11:30am     Location of Procedure:                    _ x_  [facility] (4th Floor)                        PREPARATION FOR COLONOSCOPY WITH MOVIPREP   Starting 5 days prior to your procedure 11/12/10 do not eat nuts, seeds, popcorn, corn, beans, peas,  salads, or any raw vegetables.  Do not take any fiber supplements (e.g. Metamucil, Citrucel, and Benefiber).  THE DAY BEFORE YOUR PROCEDURE         DATE: 11/16/10  DAY: Thursday  1.  Drink clear liquids the entire day-NO SOLID FOOD  2.  Do not drink anything colored red or purple.  Avoid juices with pulp.  No orange juice.  3.  Drink at least 64 oz. (8 glasses) of fluid/clear liquids during the day to prevent dehydration and help the prep work efficiently.  CLEAR LIQUIDS INCLUDE: Water Jello Ice Popsicles Tea (sugar ok, no milk/cream) Powdered fruit flavored drinks Coffee (sugar ok, no milk/cream) Gatorade Juice: apple, white grape, white cranberry  Lemonade Clear bullion, consomm, broth Carbonated beverages (any kind) Strained chicken noodle soup Hard Candy                             4.  In the morning, mix first dose of MoviPrep solution:    Empty 1 Pouch A and 1 Pouch B into the disposable container    Add lukewarm drinking water to the top line of the container. Mix to dissolve    Refrigerate (mixed solution should be used within 24 hrs)  5.  Begin drinking the prep at 5:00 p.m. The MoviPrep container is divided by 4 marks.   Every 15 minutes drink the solution down to the next mark (approximately 8 oz) until the full liter is complete.   6.  Follow completed prep with 16 oz of clear liquid of your  choice (Nothing red or purple).  Continue to drink clear liquids until bedtime.  7.  Before going to bed, mix second dose of MoviPrep solution:    Empty 1 Pouch A and 1 Pouch B into the disposable container    Add lukewarm drinking water to the top line of the container. Mix to dissolve    Refrigerate  THE DAY OF YOUR PROCEDURE      DATE: 11/17/10 DAY: Friday Beginning at 6:30 a.m. (5 hours before procedure):         1. Every 15 minutes, drink the solution down to the next mark (approx 8 oz) until the full liter is complete.  2. Follow completed prep with 16 oz. of clear liquid of your choice.    3. You may drink clear liquids until 9:30am (2 HOURS BEFORE PROCEDURE).   MEDICATION INSTRUCTIONS  Unless otherwise instructed, you should take regular prescription medications with a small sip of water   as early as possible the morning of your procedure.  Stop taking Plavix or Aggrenox on  _  _  (7 days before procedure).     Additional medication instructions: You will be contacted by our office prior to your procedure for directions on holding your Plavix.  If you do not hear from our office 1 week prior to your scheduled procedure, please call [phone] to discuss.          OTHER INSTRUCTIONS  You will need a responsible adult at least 54 years of age to accompany you and drive you home.   This person must remain in the waiting room during your procedure.  Wear loose fitting clothing that is easily removed.  Leave jewelry and other valuables at home.  However, you may wish to bring a book to read or  an iPod/MP3 player to listen to music as you wait for your procedure to start.  Remove all body piercing jewelry and leave at home.  Total time from sign-in until discharge is approximately 2-3 hours.  You should go home directly after your procedure and rest.  You can resume normal activities the  day after your procedure.  The day of your procedure you should not:    Drive   Make legal decisions   Operate machinery   Drink alcohol   Return to work  You will receive specific instructions about eating, activities and medications before you leave.    The above instructions have been reviewed and explained to me by   _______________________    I fully understand and can verbalize these instructions _____________________________ Date _________

## 2010-12-28 NOTE — Letter (Signed)
Summary: Anticoagulation Modification Letter  [facility]  [address] [place], [place] [address]   Phone: [phone]  Fax: [phone]    November 07, 2010  Re:    Bobby Pena DOB:    1956/08/01 MRN:    [id number]    Dear Dr. Eden Emms,  We have scheduled the above patient for an endoscopic procedure. Our records show that  he/she is on anticoagulation therapy. Please advise as to how long the patient may come off their therapy of Plavix prior to the scheduled procedure(s) on 11/17/10.   Please fax back/or route the completed form to [place] at [phone].  Thank you for your help with this matter.  Sincerely,  Christie Nottingham CMA [facility] Dull)   Physician Recommendation:  Hold Plavix 7 days prior ________________  Hold Coumadin 5 days prior ____________  Other ______________________________

## 2010-12-28 NOTE — Progress Notes (Signed)
Summary: stop plavix  Phone Note From Other Clinic   Caller: Nurse Summary of Call: per Marchelle Folks from GI pt needs to stop Plavix for a procedure. IS pt ok to do this? letter was sent from [facility] to Dr Eden Emms. x [phone] Initial call taken by: Edman Circle,  November 10, 2010 8:35 AM  Follow-up for Phone Call        Marchelle Folks from [facility] would like to know if pt. can be off Plavix starting monday 11/13/10. Pt is having a Colonoscopy on 11/17/10. A request letter was send x3. Marchelle Folks will call back on monday 11/13/10. Follow-up by: Ollen Gross, RN, BSN,  November 10, 2010 2:36 PM  Additional Follow-up for Phone Call Additional follow up Details #1::        ok to stop Plavix Additional Follow-up by: Colon Branch, MD, [facility],  November 13, 2010 9:26 AM    Additional Follow-up for Phone Call Additional follow up Details #2::    AMANDA AT GI AWARE PT MAY HOLD PLAVIX  AND LEFT MESSAGE FOR PT TO CALL BACK TO INFORM OF ABOVE . Follow-up by: Scherrie Bateman, LPN,  November 13, 2010 9:34 AM

## 2010-12-28 NOTE — Assessment & Plan Note (Signed)
Summary: DISCUSS TREATMENT OPTIONS PER DR Prather Failla/NT   Vital Signs:  Patient profile:   55 year old Pena Height:      77 inches Weight:      298 pounds Temp:     98.5 degrees F oral Pulse rate:   72 / minute Pulse rhythm:   regular BP sitting:   130 / 72  (left arm) Cuff size:   large  Vitals Entered By: Linde Gillis CMA [facility]) (October 05, 2010 11:53 AM) CC: discuss treatment options   History of Present Illness: 55 yo here for follow up cholesterol.  HLD- lipids remain extremely elevated, espcially TG.  Added Tricor to Simcor and lipid panel only improved a little - TG 469 ( were 592 in July), LDL 142 ( 147), HDL 34 ( 39).  He is a very high risk patient, with CAD s/p MI, PTCA/stents, heavy smoker.  Family h/o CAD.    Cough- has had a nagging dry cough for last several months.  His wife is very concerned because he is such a heavy smoker.  Productive only in the morning of brownish phlegmn.  No CP, wheezing or SOB.  Current Medications (verified): 1)  Amlodipine Besylate 5 Mg Tabs (Amlodipine Besylate) .... Take One Tablet By Mouth Daily 2)  Plavix 75 Mg Tabs (Clopidogrel Bisulfate) .... Take One Tablet By Mouth Daily 3)  Ranexa 500 Mg Xr12h-Tab (Ranolazine) .Marland Kitchen.. 1 By Mouth Two Times A Day 4)  Lexapro 20 Mg Tabs (Escitalopram Oxalate) .... Take One Tablet By Mouth Daily 5)  Aspirin 325 Mg Tabs (Aspirin) .... Take One Tablet By Mouth Daily 6)  Fexofenadine Hcl 180 Mg Tabs (Fexofenadine Hcl) .... Take One Tablet By Mouth Daily 7)  Metoprolol Tartrate 25 Mg Tabs (Metoprolol Tartrate) .... Take One Tablet By Mouth Two Times A Day 8)  Zyban 150 Mg Xr12h-Tab (Bupropion Hcl (Smoking Deter)) .Marland Kitchen.. 1 Tab Po Daily X 3 Days; Increase To 1 Tab Po Twice Daily; Treatment Should Continue For 7-12 Weeks. 9)  Androgel Pump 1 % Gel (Testosterone) .... 5 G Applied Once Daily (Preferably in The Morning) To The Shoulder and Upper Arms 10)  Singulair 10 Mg Tabs (Montelukast Sodium) .Marland Kitchen.. 1 By Mouth  Daily 11)  Crestor 40 Mg Tabs (Rosuvastatin Calcium) .Marland Kitchen.. 1 Tablet By Mouth Daily  Allergies: 1)  ! 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  started at age 20. Alcohol use-yes 10 drinks/week Drug use-no Regular exercise-yes pt is single. pt has children.  Risk Factors: Exercise: yes (05/16/2010)  Risk Factors: Smoking Status: current (05/16/2010)  Review of Systems      See HPI General:  Denies fever and malaise. ENT:  Denies nasal congestion, sinus pressure, and sore throat. CV:  Denies chest pain or discomfort. Resp:  Complains of cough and sputum productive; denies shortness of breath and wheezing.  Physical Exam  General:  obese, well developed, well nourished, no acute distress Head:  normocephalic and atraumatic.   Nose:  no external deformity.  Mouth:  Oral mucosa and oropharynx without lesions or exudates.  Teeth in good repair. Lungs:  Normal respiratory effort, chest expands symmetrically. Scattered exp wheezes, no crackles. Heart:  normal rate, regular rhythm, and no murmur.   Extremities:  no edema Psych:  Cognition and judgment appear intact. Alert and cooperative with normal attention span and concentration. No apparent delusions, illusions, hallucinations   Impression & Recommendations:  Problem # 1:  HYPERLIPIDEMIA (ICD-272.4) Assessment Unchanged Still very poorly controlled.  Will d/c  Simcor and Tricor since Tricor limits our use of statins. Start Crestor 40 mg daily.  Follow up in 6-8 weeks. The following medications were removed from the medication list:    Simcor 500-40 Mg Xr24h-tab (Niacin-simvastatin) .Marland Kitchen... Take one tablet by mouth daily    Tricor 145 Mg Tabs (Fenofibrate) .Marland Kitchen... 1 tab by mouth daily. His updated medication list for this problem includes:    Crestor 40 Mg Tabs (Rosuvastatin calcium) .Marland Kitchen... 1 tablet by mouth daily  Problem # 2:  COUGH (ICD-786.2) Assessment: New  Likely due to chronic bronchitis from smoking but due to heavy smoking history, will get a CXR to rule out other pathology.  Orders: T-2 View CXR (71020TC)  Complete Medication List: 1)  Amlodipine Besylate 5 Mg Tabs (Amlodipine besylate) .... Take one tablet by mouth daily 2)  Plavix 75 Mg Tabs (Clopidogrel bisulfate) .... Take one tablet by mouth daily 3)  Ranexa 500 Mg Xr12h-tab (Ranolazine) .Marland Kitchen.. 1 by mouth two times a day 4)  Lexapro 20 Mg Tabs (Escitalopram oxalate) .... Take one tablet by mouth daily 5)  Aspirin 325 Mg Tabs (Aspirin) .... Take one tablet by mouth daily 6)  Fexofenadine Hcl 180 Mg Tabs (Fexofenadine hcl) .... Take one tablet by mouth daily 7)  Metoprolol Tartrate 25 Mg Tabs (Metoprolol tartrate) .... Take one tablet by mouth two times a day 8)  Zyban 150 Mg Xr12h-tab (Bupropion hcl (smoking deter)) .Marland Kitchen.. 1 tab po daily x 3 days; increase to 1 tab po twice daily; treatment should continue for 7-12 weeks. 9)  Androgel Pump 1 % Gel (Testosterone) .... 5 g applied once daily (preferably in the morning) to the shoulder and upper arms 10)  Singulair 10 Mg Tabs (Montelukast sodium) .Marland Kitchen.. 1 by mouth daily 11)  Crestor 40 Mg Tabs (Rosuvastatin calcium) .Marland Kitchen.. 1 tablet by mouth daily  Patient Instructions: 1)  Please stop taking the Simcor.  Stop taking Tricor. 2)  Start taking Crestor. 3)  Triglycerides are too high.  Weight loss (even small amount) can decrease triglycerides.   Decrease added sugars, eliminate trans fats, increase fiber and limit alcohol.  All these changes together can drop triglycerides by almost 50%. 4)  Follow up fasting lipids and hepatic panel in 6-8 weeks (272.4) Prescriptions: CRESTOR 40 MG TABS (ROSUVASTATIN CALCIUM) 1 tablet by mouth daily  #90 x 3   Entered and Authorized by:   Ruthe Mannan MD   Signed by:   Ruthe Mannan MD on 10/05/2010   Method used:   Electronically to        [facility] (retail)       [address]       [place], [place]  [address]       Ph: [phone]       Fax: [phone]   RxID:   [id number]    Orders Added: 1)  T-2 View CXR [71020TC] 2)  Est. Patient Level  IV [id number]    Current Allergies (reviewed today): ! CHANTIX  Appended Document: DISCUSS TREATMENT OPTIONS PER DR Shahira Fiske/NT     Allergies: 1)  ! Chantix   Complete Medication List: 1)  Amlodipine Besylate 5 Mg Tabs (Amlodipine besylate) .... Take one tablet by mouth daily 2)  Plavix 75 Mg Tabs (Clopidogrel bisulfate) .... Take one tablet by mouth daily 3)  Ranexa 500 Mg Xr12h-tab (Ranolazine) .Marland Kitchen.. 1 by mouth two times a day 4)  Lexapro 20 Mg Tabs (Escitalopram oxalate) .... Take one tablet by mouth daily 5)  Aspirin 325 Mg Tabs (Aspirin) .... Take one tablet by mouth daily 6)  Fexofenadine Hcl 180 Mg Tabs (Fexofenadine hcl) .... Take one tablet by mouth daily 7)  Metoprolol Tartrate 25 Mg Tabs (Metoprolol tartrate) .... Take one tablet by mouth two times a day 8)  Zyban 150 Mg Xr12h-tab (Bupropion hcl (smoking deter)) .Marland Kitchen.. 1 tab po daily x 3 days; increase to 1 tab po twice daily; treatment should continue for 7-12 weeks. 9)  Androgel Pump 1 % Gel (Testosterone) .... 5 g applied once daily (preferably in the morning) to the shoulder and upper arms 10)  Singulair 10 Mg Tabs (Montelukast sodium) .Marland Kitchen.. 1 by mouth daily 11)  Crestor 40 Mg Tabs (Rosuvastatin calcium) .Marland Kitchen.. 1 tablet by mouth  daily  Other Orders: Admin 1st Vaccine (59563) Flu Vaccine 48yrs + [id number])   Orders Added: 1)  Admin 1st Vaccine [90471] 2)  Flu Vaccine 30yrs + [33295]    Flu Vaccine Consent Questions     Do you have a history of severe allergic reactions to this vaccine? no    Any prior history of allergic reactions to egg and/or gelatin? no    Do you have a sensitivity to the preservative Thimersol? no    Do you have a past history of Guillan-Barre Syndrome? no    Do you currently have an acute febrile illness? no    Have you ever had a severe reaction to latex? no    Vaccine information given and explained to patient? yes    Are you currently pregnant? no    Lot Number:[id number]   Exp Date:05/26/2011   Site Given  Left Deltoid IM

## 2010-12-28 NOTE — Letter (Signed)
Summary: New Patient letter  [facility]  [address] [place], [place] [address]   Phone: [phone]  Fax: [phone]       09/29/2010 MRN: [id number]  Bobby Pena [address] [place], [place]  [address]  Dear Bobby Pena,  Welcome to the Gastroenterology Division at [facility].    You are scheduled to see Dr. Russella Dar on 11/07/2010 at 10:00AM on the 3rd floor at [facility], [address]  We ask that you try to arrive at our office 15 minutes prior to your appointment time to allow for check-in.  We would like you to complete the enclosed self-administered evaluation form prior to your visit and bring it with you on the day of your appointment.  We will review it with you.  Also, please bring a complete list of all your medications or, if you prefer, bring the medication bottles and we will list them.  Please bring your insurance card so that we may make a copy of it.  If your insurance requires a referral to see a specialist, please bring your referral form from your primary care physician.  Co-payments are due at the time of your visit and may be paid by cash, check or credit card.     Your office visit will consist of a consult with your physician (includes a physical exam), any laboratory testing he/she may order, scheduling of any necessary diagnostic testing (e.g. x-ray, ultrasound, CT-scan), and scheduling of a procedure (e.g. Endoscopy, Colonoscopy) if required.  Please allow enough time on your schedule to allow for any/all of these possibilities.    If you cannot keep your appointment, please call [phone] to cancel or reschedule prior to your appointment date.  This allows [place] the opportunity to schedule an appointment for another patient in need of care.  If you do not cancel or reschedule by 5 p.m. the business day prior to your appointment date, you will be charged a $50.00 late cancellation/no-show fee.    Thank you for choosing [facility] for your medical needs.  We appreciate the opportunity to care for you.  Please visit [place] at our website  to learn more about our practice.                     Sincerely,                                                             The Gastroenterology Division

## 2010-12-28 NOTE — Letter (Signed)
Summary: Patient Notice- Polyp Results  [facility]  [address] [place], [place] [address]   Phone: [phone]  Fax: [phone]        November 22, 2010 MRN: [id number]    Bobby Pena [address] [place], [place]  [address]    Dear Mr. Capistran,  I am pleased to inform you that the colon polyps removed during your recent colonoscopy were found to be benign (no cancer detected) upon pathologic examination.  I recommend you have a repeat colonoscopy examination in 3 years to look for recurrent polyps, as having colon polyps increases your risk for having recurrent polyps or even colon cancer in the future.  Should you develop new or worsening symptoms of abdominal pain, bowel habit changes or bleeding from the rectum or bowels, please schedule an evaluation with either your primary care physician or with me.  Continue treatment plan as outlined the day of your exam.  Please call us if you are having persistent problems or have questions about your condition that have not been fully answered at this time.  Sincerely,  Meryl Dare MD [facility]  This letter has been electronically signed by your physician.  Appended Document: Patient Notice- Polyp Results Letter mailed

## 2010-12-28 NOTE — Progress Notes (Signed)
Summary: wants to go to urologist  Phone Note Call from Patient Call back at Home Phone [phone]   Caller: Significant Other Summary of Call: Pt's girlfriend, [name] Gillum is asking that pt be referred to urologist for night time incontinence.  She said he is wetting his bed.  She also wants his prostate checked.   He prefers to go to [place]. Initial call taken by: Lowella Petties CMA,  August 21, 2010 11:07 AM  New Problems: OTHER URINARY INCONTINENCE (ICD-788.39)   New Problems: OTHER URINARY INCONTINENCE (ICD-788.39)

## 2010-12-28 NOTE — Progress Notes (Signed)
Summary: ?CPAP vs BiPAP  Phone Note From Other Clinic   Caller: Tresa Endo with [facility] Patient/[place] Call For: Dr. Shelle Iron Summary of Call: [phone] Tresa Endo with [facility] called and stated that pt has been on BiPap, pressure of 22/18 since 2009 by Dr. Tresa Endo. (Sleep Study scanned into emr).  In August 2011 patient was seen and a new order for a s9 escape/auto x 2 wks with download was sent to [facility]. Tresa Endo states that they interpreted this order to be a "loaner CPAP" not an order for a new cpap.  I advised Tresa Endo with [facility] to get [place] the download off of the auto cpap and fax this to [place] in order for you to make the determination on what he needs.  Tresa Endo will contact pt to obtain card, download and fax to you.  Pt has contacted [facility] for an order for a new BiPap. Please advise. Thanks, Alfonso Ramus  October 25, 2010 4:38 PM  Initial call taken by: Alfonso Ramus,  October 25, 2010 4:38 PM  Follow-up for Phone Call        I question the accuracy of his last titration study, and whether he really needs bipap or not.  Therefore, I would like to get auto download and look at the data before making a determination.  Called and advised Tresa Endo with [facility] that Dr. Shelle Iron needs the cpap auto download that was requested in August before he can make any determination. Tresa Endo stated that she would let the patient know. Nolon Rod that once she obtains this download to call me back at [phone] and I would present this to Dr. Shelle Iron for determination if he need CPAP or BiPap. Alfonso Ramus  October 26, 2010 12:52 PM [facility] states that this has been faxed several times before, however, we do not have it scanned into EMR. [facility] will refax download. Rhonda Cobb  October 27, 2010 11:08 AM  Beth with [facility] called and stated that they never download CPAP. Have contacted pt and he is mailing the card in. Once [employer], will give download and forward message back to Dr. Shelle Iron for review. Rhonda Cobb  October 27, 2010 1:37 PM Spoke with Waynetta Sandy at [facility] in [place] [phone]. She stated that they have been trying to get in contact with patient.  I called and spoke with pt who stated that he just recvd the card in the mail a couple of days ago and is in the process of mailing this back to [facility]. Pt is wanting to change home care companies once download has been recvd. Advised pt that once download has been recvd. we will contact him about changing companies. Rhonda Cobb  November 02, 2010 3:13 PM

## 2010-12-28 NOTE — Progress Notes (Signed)
Summary: wants to change from generic allegra  Phone Note Call from Patient Call back at [phone]   Caller: Significant Other- [name] Call For: Bobby [facility]ena Summary of Call: Generic allegra was called to [facility].  Insurance wont cover this because it's otc.  Pt wants something similar in name brand only so that insurance will cover.  Uses [facility] [address]. Initial call taken by: Lowella Petties CMA,  August 15, 2010 2:57 PM  Follow-up for Phone Call        Can you please ask him what he would like called in?  I think they are all OTC. Bobby Pena  August 16, 2010 8:42 AM  Would singulair be an option?  Lowella Petties CMA  August 16, 2010 10:07 AM  May not be as effective but we can try.  I will call in. Bobby Pena  August 16, 2010 10:55 AM   Additional Follow-up for Phone Call Additional follow up Details #1::        Advised pt's girlfriend. Additional Follow-up by: Lowella Petties CMA,  August 16, 2010 11:03 AM    New/Updated Medications: SINGULAIR 10 MG TABS (MONTELUKAST SODIUM) 1 by mouth daily Prescriptions: SINGULAIR 10 MG TABS (MONTELUKAST SODIUM) 1 by mouth daily  #30 x 3   Entered and Authorized by:   Bobby [facility]   Signed by:   Bobby Pena on 08/16/2010   Method used:   [facility]        [facility] [address])       [address]       [place], [place]  [address]       Ph: [phone]       Fax: [phone]   RxID:   [id number]

## 2010-12-28 NOTE — Letter (Signed)
Summary: Previsit letter  [facility]  [address] [place], [place] [address]   Phone: [phone]  Fax: [phone]       06/23/2010 MRN: [id number]  [name] [address] [place], [place]  [address]  Dear Mr. Older,  Welcome to the Gastroenterology Division at [facility].    You are scheduled to see a nurse for your pre-procedure visit on July 20, 2010 at 10:00am on the 3rd floor at [facility], [address]  We ask that you try to arrive at our office 15 minutes prior to your appointment time to allow for check-in.  Your nurse visit will consist of discussing your medical and surgical history, your immediate family medical history, and your medications.    Please bring a complete list of all your medications or, if you prefer, bring the medication bottles and we will list them.  We will need to be aware of both prescribed and over the counter drugs.  We will need to know exact dosage information as well.  If you are on blood thinners (Coumadin, Plavix, Aggrenox, Ticlid, etc.) please call our office today/prior to your appointment, as we need to consult with your physician about holding your medication.   Please be prepared to read and sign documents such as consent forms, a financial agreement, and acknowledgement forms.  If necessary, and with your consent, a friend or relative is welcome to sit-in on the nurse visit with you.  Please bring your insurance card so that we may make a copy of it.  If your insurance requires a referral to see a specialist, please bring your referral form from your primary care physician.  No co-pay is required for this nurse visit.     If you cannot keep your appointment, please call [phone] to cancel or reschedule prior to your appointment date.  This allows [place] the opportunity to schedule an appointment for another patient in need of care.    Thank you for choosing [facility] for your medical  needs.  We appreciate the opportunity to care for you.  Please visit [place] at our website  to learn more about our practice.                     Sincerely.                                                                                                                   The Gastroenterology Division

## 2010-12-28 NOTE — Assessment & Plan Note (Signed)
Summary: MEDS NEED TO BE CHECKED AND CHECK TESTOSTERONE/JRR   Vital Signs:  Patient profile:   55 year old male Height:      77 inches Weight:      303.50 pounds BMI:     36.12 Temp:     98.1 degrees F oral Pulse rate:   76 / minute Pulse rhythm:   regular BP sitting:   130 / 90  (right arm) Cuff size:   large  Vitals Entered By: Linde Gillis CMA Duncan Dull) (October 30, 2010 8:51 AM) CC: medication check, check testosterone   History of Present Illness: 55 yo here for follow up cholesterol.  HLD- lipids remain extremely elevated, espcially TG.  Added Tricor to Simcor and lipid panel only improved a little - TG 469 ( were 592 in July), LDL 142 ( 147), HDL 34 ( 39).  He is a very high risk patient, with CAD s/p MI, PTCA/stents, heavy smoker.  Family h/o CAD.  Last month, stopped Simcor and tricor and started [employer].  He is having no complaints.   Low testosterone- Testosterone was 160 in July, added Androgel 1%. Testosterone increased to 315.  His energy and libido mildly improved.  No noticible side effects.    Allergies: 1)  ! Chantix  Review of Systems      See HPI General:  Denies malaise. Eyes:  Denies blurring. ENT:  Denies difficulty swallowing. CV:  Denies chest pain or discomfort. Resp:  Denies shortness of breath. GI:  Denies abdominal pain.  Physical Exam  General:  obese, well developed, well nourished, no acute distress Mouth:  Oral mucosa and oropharynx without lesions or exudates.  Teeth in good repair. Lungs:  Normal respiratory effort, chest expands symmetrically. Scattered exp wheezes, no crackles. Heart:  normal rate, regular rhythm, and no murmur.   Extremities:  no edema Neurologic:  alert & oriented X3 and gait normal.   Skin:  actinic keratosis and nevus.   Psych:  Cognition and judgment appear intact. Alert and cooperative with normal attention span and concentration. No apparent delusions, illusions, hallucinations   Impression &  Recommendations:  Problem # 1:  OTHER TESTICULAR HYPOFUNCTION (ICD-257.2) Assessment Unchanged recheck testosterone and CBC today. Orders: Venipuncture (98119) TLB-CBC Platelet - w/Differential (85025-CBCD) TLB-Testosterone, Total (84403-TESTO)  Problem # 2:  HYPERLIPIDEMIA (ICD-272.4) Assessment: Unchanged recheck lipid panel and hepatic panel today. continue current dose of crestor for now. His updated medication list for this problem includes:    Crestor 40 Mg Tabs (Rosuvastatin calcium) .Marland Kitchen... 1 tablet by mouth daily  Orders: Venipuncture (14782) TLB-Lipid Panel (80061-LIPID) TLB-Hepatic/Liver Function Pnl (80076-HEPATIC)  Complete Medication List: 1)  Amlodipine Besylate 5 Mg Tabs (Amlodipine besylate) .... Take one tablet by mouth daily 2)  Plavix 75 Mg Tabs (Clopidogrel bisulfate) .... Take one tablet by mouth daily 3)  Ranexa 500 Mg Xr12h-tab (Ranolazine) .Marland Kitchen.. 1 by mouth two times a day 4)  Lexapro 20 Mg Tabs (Escitalopram oxalate) .... Take one tablet by mouth daily 5)  Aspirin 325 Mg Tabs (Aspirin) .... Take one tablet by mouth daily 6)  Fexofenadine Hcl 180 Mg Tabs (Fexofenadine hcl) .... Take one tablet by mouth daily 7)  Metoprolol Tartrate 25 Mg Tabs (Metoprolol tartrate) .... Take one tablet by mouth two times a day 8)  Zyban 150 Mg Xr12h-tab (Bupropion hcl (smoking deter)) .Marland Kitchen.. 1 tab po daily x 3 days; increase to 1 tab po twice daily; treatment should continue for 7-12 weeks. 9)  Androgel Pump 1 % Gel (  Testosterone) .... 5 g applied once daily (preferably in the morning) to the shoulder and upper arms 10)  Singulair 10 Mg Tabs (Montelukast sodium) .Marland Kitchen.. 1 by mouth daily 11)  Crestor 40 Mg Tabs (Rosuvastatin calcium) .Marland Kitchen.. 1 tablet by mouth daily  Other Orders: TLB-BMP (Basic Metabolic Panel-BMET) (80048-METABOL)   Orders Added: 1)  Venipuncture [36415] 2)  TLB-Lipid Panel [80061-LIPID] 3)  TLB-CBC Platelet - w/Differential [85025-CBCD] 4)  TLB-Hepatic/Liver  Function Pnl [80076-HEPATIC] 5)  TLB-Testosterone, Total [84403-TESTO] 6)  TLB-BMP (Basic Metabolic Panel-BMET) [80048-METABOL] 7)  Est. Patient Level IV [[address]]    Current Allergies (reviewed today): ! CHANTIX

## 2010-12-28 NOTE — Miscellaneous (Signed)
Summary: dir col scr-age,.....em  Clinical Lists Changes  atient had hx of MI and is on plavix.  Pt to have new pt visit with Dr. Russella Dar in October.

## 2010-12-28 NOTE — Procedures (Signed)
Summary: Colonoscopy  Patient: [name] Note: All result statuses are Final unless otherwise noted.  Tests: (1) Colonoscopy (COL)   COL Colonoscopy           DONE     [facility]     [address]     [place], [place]  [address]           COLONOSCOPY PROCEDURE REPORT           PATIENT:  Bobby Pena, Bobby Pena  MR#:  [id number]     BIRTHDATE:  04/06/1956, 54 yrs. old  GENDER:  male     ENDOSCOPIST:  Judie Petit T. Russella Dar, MD, Clementeen Graham           [facility] DATE:  11/17/2010     PROCEDURE:  Colonoscopy with biopsy and snare polypectomy     ASA CLASS:  Class II     INDICATIONS:  1) surveillance and high-risk screening  2) history     of adenomatous colon polyps: 2001     MEDICATIONS:   Fentanyl 100 mcg IV, Versed 12.5 mg IV, Benadryl 25     mg IV     DESCRIPTION OF PROCEDURE:   After the risks benefits and     alternatives of the procedure were thoroughly explained, informed     consent was obtained.  Digital rectal exam was performed and     revealed no abnormalities.   The LB [id number] endoscope     was introduced through the anus and advanced to the cecum, which     was identified by both the appendix and ileocecal valve, without     limitations.  The quality of the prep was good, using MoviPrep.     The instrument was then slowly withdrawn as the colon was fully     examined.     <<PROCEDUREIMAGES>>     FINDINGS:  A sessile polyp was found at the ileocecal valve. It     was 4 mm in size. The polyp was removed using cold biopsy forceps.     A sessile polyp was found at the hepatic flexure. It was 5 mm in     size. Polyp was snared without cautery. Retrieval was successful.     Three polyps were found in the descending colon. They were 5 mm in     size. Polyps were snared without cautery. Retrieval was     successful. Two polyps were found in the sigmoid colon. They were     4 mm in size. Polyps were snared without cautery. Retrieval was     successful. This was otherwise  a normal examination of the colon.     Retroflexed views in the rectum revealed no abnormalities. The time     to cecum =  3  minutes. The scope was then withdrawn (time =     12.75  min) from the patient and the procedure completed.           COMPLICATIONS:  None           ENDOSCOPIC IMPRESSION:     1) 4 mm sessile polyp at the ileocecal valve     2) 5 mm sessile polyp at the hepatic flexure     3) 5 mm Three polyps in the descending colon     4) 4 mm Two polyps in the sigmoid colon           RECOMMENDATIONS:     1) Await pathology results  2) Repeat Colonoscopy in 3 years pending pathology review     3) Resume Plavix today           Malcolm T. Russella Dar, MD, Clementeen Graham           n.     eSIGNED:   Venita Lick. Stark at 11/17/2010 12:19 PM           Laverle Hobby, [id number]  Note: An exclamation mark (!) indicates a result that was not dispersed into the flowsheet. Document Creation Date: 11/17/2010 12:19 PM _______________________________________________________________________  (1) Order result status: Final Collection or observation date-time: 11/17/2010 12:13 Requested date-time:  Receipt date-time:  Reported date-time:  Referring Physician:   Ordering Physician: Claudette Head [phone]) Specimen Source:  Source: Launa Grill Order Number: [id number] Lab site:   Appended Document: Colonoscopy     Procedures Next Due Date:    Colonoscopy: 10/2013

## 2010-12-28 NOTE — Letter (Signed)
Summary: [facility] Instructions  [facility]  [address] [place], [place] [address]   Phone: [phone]  Fax: [phone]       Bobby Pena    Jun 04, 1956    MRN: [id number]        Procedure Day Dorna Bloom:  Lenor Coffin  08/03/10     Arrival Time:  10:30AM     Procedure Time:  11:30AM     Location of Procedure:                    _ X_  [facility] (4th Floor)                       PREPARATION FOR COLONOSCOPY WITH MOVIPREP   Starting 5 days prior to your procedure 07/29/10 do not eat nuts, seeds, popcorn, corn, beans, peas,  salads, or any raw vegetables.  Do not take any fiber supplements (e.g. Metamucil, Citrucel, and Benefiber).  THE DAY BEFORE YOUR PROCEDURE         DATE: 08/02/10  DAY: WEDNESDAY  1.  Drink clear liquids the entire day-NO SOLID FOOD  2.  Do not drink anything colored red or purple.  Avoid juices with pulp.  No orange juice.  3.  Drink at least 64 oz. (8 glasses) of fluid/clear liquids during the day to prevent dehydration and help the prep work efficiently.  CLEAR LIQUIDS INCLUDE: Water Jello Ice Popsicles Tea (sugar ok, no milk/cream) Powdered fruit flavored drinks Coffee (sugar ok, no milk/cream) Gatorade Juice: apple, white grape, white cranberry  Lemonade Clear bullion, consomm, broth Carbonated beverages (any kind) Strained chicken noodle soup Hard Candy                             4.  In the morning, mix first dose of MoviPrep solution:    Empty 1 Pouch A and 1 Pouch B into the disposable container    Add lukewarm drinking water to the top line of the container. Mix to dissolve    Refrigerate (mixed solution should be used within 24 hrs)  5.  Begin drinking the prep at 5:00 p.m. The MoviPrep container is divided by 4 marks.   Every 15 minutes drink the solution down to the next mark (approximately 8 oz) until the full liter is complete.   6.  Follow completed prep with 16 oz of clear liquid of your choice  (Nothing red or purple).  Continue to drink clear liquids until bedtime.  7.  Before going to bed, mix second dose of MoviPrep solution:    Empty 1 Pouch A and 1 Pouch B into the disposable container    Add lukewarm drinking water to the top line of the container. Mix to dissolve    Refrigerate  THE DAY OF YOUR PROCEDURE      DATE: 08/03/10   DAY:   THURSDAY  Beginning at 6:30AM (5 hours before procedure):         1. Every 15 minutes, drink the solution down to the next mark (approx 8 oz) until the full liter is complete.  2. Follow completed prep with 16 oz. of clear liquid of your choice.    3. You may drink clear liquids until 9:30AM (2 HOURS BEFORE PROCEDURE).   MEDICATION INSTRUCTIONS  Unless otherwise instructed, you should take regular prescription medications with a small sip of water   as early as possible  the morning of your procedure.  Diabetic patients - see separate instructions.  Stop taking Plavix or Aggrenox on  _  _  (7 days before procedure).     Stop taking Coumadin on  _ _  (5 days before procedure).  Additional medication instructions: _         OTHER INSTRUCTIONS  You will need a responsible adult at least 54 years of age to accompany you and drive you home.   This person must remain in the waiting room during your procedure.  Wear loose fitting clothing that is easily removed.  Leave jewelry and other valuables at home.  However, you may wish to bring a book to read or  an iPod/MP3 player to listen to music as you wait for your procedure to start.  Remove all body piercing jewelry and leave at home.  Total time from sign-in until discharge is approximately 2-3 hours.  You should go home directly after your procedure and rest.  You can resume normal activities the  day after your procedure.  The day of your procedure you should not:   Drive   Make legal decisions   Operate machinery   Drink alcohol   Return to work  You will  receive specific instructions about eating, activities and medications before you leave.    The above instructions have been reviewed and explained to me by   Clide Cliff, RN_____________________  PATIENT IS ON PLAVIX AND HAD MI;  WILL BE SEEING DR Russella Dar FOR NEW PT. IN OCTOBER.  I fully understand and can verbalize these instructions _____________________________ Date _________

## 2010-12-28 NOTE — Progress Notes (Signed)
Summary: questions re medications  Phone Note Call from Patient   Caller: Patient [phone] Reason for Call: Talk to Nurse Summary of Call: pt calling re his medications, since last test was normal can his meds be reviewed to see if can come off any of them, also wants an rx for viagra, can he get this? uses [facility] [address] Initial call taken by: Glynda Jaeger,  September 07, 2010 12:10 PM  Follow-up for Phone Call        adv pt spouse that will have MD review med list and discuss viagra and let them know w/in next few days. spouse states pt uses nitro spray but has not used for several months.  Follow-up by: Claris Gladden RN,  September 07, 2010 1:34 PM  Additional Follow-up for Phone Call Additional follow up Details #1::        Has known CAD with previous IMI and stent.  Should not take Viagra and all of his cardiac meds are appropriate.   Additional Follow-up by: Colon Branch, MD, [facility],  September 07, 2010 5:33 PM    Additional Follow-up for Phone Call Additional follow up Details #2::    spouse aware of md recommendations Deliah Goody, RN  September 08, 2010 12:04 PM

## 2010-12-28 NOTE — Letter (Signed)
Summary: Records Dated 03-09-09 thru 04-11-09/[facility]  Records Dated 03-09-09 thru 04-11-09/[facility]   Imported By: Lanelle Bal 08/25/2010 09:02:01  _____________________________________________________________________  External Attachment:    Type:   Image     Comment:   External Document

## 2010-12-28 NOTE — Miscellaneous (Signed)
Summary: Appointment Canceled  Appointment status changed to canceled by [employer] on 08/02/2010 10:38 AM.  Cancellation Comments --------------------- wt 301/c r/s/272.4/nishan/uch/prec. req/saf  Appointment Information ----------------------- Appt Type:  CARDIOLOGY NUCLEAR TESTING      Date:  Thursday, August 03, 2010      Time:  7:30 AM for 15 min   Urgency:  Routine   Made By:  Hoy Finlay Scheduler  To Visit:  [id number]    Reason:  wt 301/c r/s/272.4/nishan/uch/prec. req/saf  Appt Comments ------------- -- 08/02/10 10:38: ([facility]) CANCELED -- wt 301/c r/s/272.4/nishan/uch/prec. req/saf -- 07/28/10 16:33: ([facility]) BOOKED -- Routine CARDIOLOGY NUCLEAR TESTING at 08/03/2010 7:30 AM for 15 min wt 301/c r/s/272.4/nishan/uch/prec. req/saf -- 07/27/10 14:20: ([facility])

## 2010-12-28 NOTE — Progress Notes (Signed)
Summary: Have questions regarding pt  Phone Note Call from Patient Call back at [phone]   Caller: Spouse/[name] Summary of Call: Pt calling with questions about the pt doing [facility]rise program Initial call taken by: Judie Grieve,  December 05, 2010 2:52 PM  Follow-up for Phone Call        spoke with pt wife, he is interested in cardiac rehab that he never did after his mi. will fax info to [place].  Deliah Goody, RN  December 05, 2010 3:56 PM

## 2010-12-28 NOTE — Assessment & Plan Note (Signed)
Summary: COL, Plavix...as.   History of Present Illness Visit Type: Initial Visit Primary GI MD: Elie Goody MD [facility] Primary Provider: Ruthe Mannan MD Chief Complaint: screening colonoscopy  on Plavix History of Present Illness:   This is a 55 year old male, who relates a history of colon polyps on prior colonoscopies by Dr. Charm Barges. His last colonoscopy was about 10 years ago. Unfortunately I do not have records available from his prior colonoscopy. He has no gastrointestinal complaints.  He is maintained on Plavix for a history of coronary artery disease, and a coronary stent was placed in 2004. He does not have regular followup with his cardiologist and he has seen Dr. Clarene Duke in the past.   GI Review of Systems      Denies abdominal pain, acid reflux, belching, bloating, chest pain, dysphagia with liquids, dysphagia with solids, heartburn, loss of appetite, nausea, vomiting, vomiting blood, weight loss, and  weight gain.        Denies anal fissure, black tarry stools, change in bowel habit, constipation, diarrhea, diverticulosis, fecal incontinence, heme positive stool, hemorrhoids, irritable bowel syndrome, jaundice, light color stool, liver problems, rectal bleeding, and  rectal pain.   Current Medications (verified): 1)  Amlodipine Besylate 5 Mg Tabs (Amlodipine Besylate) .... Take One Tablet By Mouth Daily 2)  Plavix 75 Mg Tabs (Clopidogrel Bisulfate) .... Take One Tablet By Mouth Daily 3)  Ranexa 500 Mg Xr12h-Tab (Ranolazine) .Marland Kitchen.. 1 By Mouth Two Times A Day 4)  Lexapro 20 Mg Tabs (Escitalopram Oxalate) .... Take One Tablet By Mouth Daily 5)  Aspirin 325 Mg Tabs (Aspirin) .... Take One Tablet By Mouth Daily 6)  Fexofenadine Hcl 180 Mg Tabs (Fexofenadine Hcl) .... Take One Tablet By Mouth Daily 7)  Metoprolol Tartrate 25 Mg Tabs (Metoprolol Tartrate) .... Take One Tablet By Mouth Once Daily 8)  Zyban 150 Mg Xr12h-Tab (Bupropion Hcl (Smoking Deter)) .Marland Kitchen.. 1 Tab Po Daily X 3 Days;  Increase To 1 Tab Po Twice Daily; Treatment Should Continue For 7-12 Weeks. 9)  Androgel Pump 1 % Gel (Testosterone) .... 5 G Applied Once Daily (Preferably in The Morning) To The Shoulder and Upper Arms 10)  Singulair 10 Mg Tabs (Montelukast Sodium) .Marland Kitchen.. 1 By Mouth Daily 11)  Crestor 10 Mg Tabs (Rosuvastatin Calcium) .Marland Kitchen.. 1 Tablet By Mouth Daily 12)  Gemfibrozil 600 Mg Tabs (Gemfibrozil) .Marland Kitchen.. 1 Tab By Mouth Two Times A Day - 30 Minutes Prior To Breakfast and Dinner.  Allergies (verified): 1)  ! Chantix  Past History:  Past Medical History: HYPERTENSION HYPERLIPIDEMIA  MYOCARDIAL INFARCTION, HX OF  OTHER SPECIFIED ENDOCRINE DISORDERS TOBACCO ABUSE  COLONIC POLYPS, HX OF  NEVUS SLEEP APNEA  OBSTRUCTIVE SLEEP APNEA  DEPRESSION Arthritis Asthma Congestive Heart Failure  Past Surgical History: Reviewed history from 11/02/2010 and no changes required. PTCA/stent   2004 tonsillectomy nasal surgery B shoulder surgeries B knee surgeries ankle surgery Umbilical hernia repair  Family History: Reviewed history from 06/23/2010 and no changes required. Mother: D - cancer  Lung Uncle - MI at 20 No FH of Colon Cancer:  Social History: Reviewed history from 07/10/2010 and no changes required. 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  started at age 89. Alcohol use-yes 10 drinks/week Drug use-no Regular exercise-yes pt is single. pt has children.  Review of Systems       The patient complains of arthritis/joint pain, back pain, and hearing problems.         The pertinent positives and negatives  are noted as above and in the HPI. All other ROS were reviewed and were negative.   Vital Signs:  Patient profile:   55 year old male Height:      77 inches Weight:      298 pounds BMI:     35.47 Pulse rate:   68 / minute Pulse rhythm:   regular BP sitting:   130 / 80  (left arm)  Vitals Entered By: Milford Cage NCMA (November 07, 2010 10:30  AM)  Physical Exam  General:  Well developed, well nourished, no acute distress. Head:  Normocephalic and atraumatic. Eyes:  PERRLA, no icterus. Mouth:  No deformity or lesions, dentition normal. Lungs:  Clear throughout to auscultation. Heart:  Regular rate and rhythm; no murmurs, rubs,  or bruits. Abdomen:  Soft, nontender and nondistended. No masses, hepatosplenomegaly or hernias noted. Normal bowel sounds. Rectal:  deferred until time of colonoscopy.   Msk:  Symmetrical with no gross deformities. Normal posture. Extremities:  No clubbing, cyanosis, edema or deformities noted. Neurologic:  Alert and  oriented x4;  grossly normal neurologically. Cervical Nodes:  No significant cervical adenopathy. Inguinal Nodes:  No significant inguinal adenopathy. Psych:  Alert and cooperative. Normal mood and affect.  Impression & Recommendations:  Problem # 1:  COLONIC POLYPS, HX OF (ICD-V12.72) Prior history of colon polyps, type unknown. We will attempt to obtain records from Dr. Silvana Newness office however since it has been 10 years since his last colonoscopy, he is due for screening even if his polyps were not precancerous. The risks, benefits and alternatives to colonoscopy with possible biopsy and possible polypectomy were discussed with the patient and they consent to proceed. The procedure will be scheduled electively. Orders: Colonoscopy (Colon)  Problem # 2:  CAD (ICD-414.00) Remote history of a coronary artery stent. Plan for a 5 day hold of Plavix and he may continue on aspirin. Will obtain clearance from Dr. Dayton Martes.  Patient Instructions: 1)  Pick up your prep from your pharmacy.  2)  Colonoscopy brochure given.  3)  Copy sent to : Ruthe Mannan, MD 4)  The medication list was reviewed and reconciled.  All changed / newly prescribed medications were explained.  A complete medication list was provided to the patient / caregiver.  Prescriptions: MOVIPREP 100 GM  SOLR  (PEG-KCL-NACL-NASULF-NA ASC-C) As per prep instructions.  #1 x 0   Entered by:   Christie Nottingham CMA (AAMA)   Authorized by:   Meryl Dare MD [facility]   Signed by:   Christie Nottingham CMA (AAMA) on 11/07/2010   Method used:   Electronically to        [facility] [address] (retail)       [address], [address]       [place], [place]  [address]       Ph: [phone]       Fax: [phone]   RxID:   [id number]

## 2010-12-28 NOTE — Consult Note (Signed)
Summary: [facility]   Imported By: Maryln Gottron 10/12/2010 12:23:35  _____________________________________________________________________  External Attachment:    Type:   Image     Comment:   External Document

## 2010-12-28 NOTE — Progress Notes (Signed)
Summary: Rx Androgel Pump 1%  Phone Note Refill Request Call back at [phone] Message from:  [facility] [address] on August 15, 2010 10:27 AM  Refills Requested: Medication #1:  ANDROGEL PUMP 1 % GEL 5 g applied once daily (preferably in the morning) to the shoulder and upper arms.   Last Refilled: 06/27/2010 Received faxed refill request please advise.   Method Requested: Electronic Initial call taken by: Linde Gillis CMA Duncan Dull),  August 15, 2010 10:27 AM  Follow-up for Phone Call        Rx called to pharmacy Follow-up by: Linde Gillis CMA Duncan Dull),  August 15, 2010 11:04 AM    Prescriptions: ANDROGEL PUMP 1 % GEL (TESTOSTERONE) 5 g applied once daily (preferably in the morning) to the shoulder and upper arms  #1 x 0   Entered and Authorized by:   Ruthe Mannan MD   Signed by:   Ruthe Mannan MD on 08/15/2010   Method used:   Telephoned to ...       [facility] [address] (retail)       [address], [address]       [place], [place]  [address]       Ph: [phone]       Fax: [phone]   RxID:   [id number]

## 2010-12-28 NOTE — Assessment & Plan Note (Signed)
Summary: consult for management of osa   Copy to:  Ruthe Mannan Primary Provider/Referring Provider:  Ruthe Mannan MD  CC:  Sleep Consult.  History of Present Illness: The pt is a 55y/o male who I have been asked to see for management of osa.  He was diagnosed with OSA 6-7 yrs ago, and did very well with a cpap device.  Approx. 1.5 yrs ago, his machine quit working, and he has not been treated since.  He is unable to tell me where he had his sleep study, and does not remember his DME.  Currently, he is having loud snoring, as well as pauses in his breathing during sleep.  He goes to bed at 10pm, and arises at 4:30 am to start his day.  He is not rested upon arising.  He notes significant sleep pressure during the day if he is not active., and can easily fall asleep in the evening watching tv or movies.  He has some sleep pressure with driving.  He tells me that his weight is up about 20 pounds over the last 2 years, and his epworth score today is 8.  Medications Prior to Update: 1)  Amlodipine Besylate 5 Mg Tabs (Amlodipine Besylate) .... Take One Tablet By Mouth Daily 2)  Plavix 75 Mg Tabs (Clopidogrel Bisulfate) .... Take One Tablet By Mouth Daily 3)  Ranexa 500 Mg Xr12h-Tab (Ranolazine) .Marland Kitchen.. 1 By Mouth Two Times A Day 4)  Lexapro 20 Mg Tabs (Escitalopram Oxalate) .... Take One Tablet By Mouth Daily 5)  Aspirin 325 Mg Tabs (Aspirin) .... Take One Tablet By Mouth Daily 6)  Fexofenadine Hcl 180 Mg Tabs (Fexofenadine Hcl) .... Take One Tablet By Mouth Daily 7)  Metoprolol Tartrate 25 Mg Tabs (Metoprolol Tartrate) .... Take One Tablet By Mouth Two Times A Day 8)  Simcor 500-40 Mg Xr24h-Tab (Niacin-Simvastatin) .... Take One Tablet By Mouth Daily 9)  Zyban 150 Mg Xr12h-Tab (Bupropion Hcl (Smoking Deter)) .Marland Kitchen.. 1 Tab Po Daily X 3 Days; Increase To 1 Tab Po Twice Daily; Treatment Should Continue For 7-12 Weeks. 10)  Tricor 145 Mg Tabs (Fenofibrate) .Marland Kitchen.. 1 Tab By Mouth Daily. 11)  Androgel Pump 1 % Gel  (Testosterone) .... 5 G Applied Once Daily (Preferably in The Morning) To The Shoulder and Upper Arms  Allergies (verified): No Known Drug Allergies  Past History:  Past Medical History: Depression Hyperlipidemia Hypertension Myocardial infarction, hx of Sleep apnea  Past Surgical History: PTCA/stent   2004 tonsillectomy nasal surgery B shoulder surgeries B knee surgeries ankle surgery  Family History: Reviewed history from 06/23/2010 and no changes required. Mother: D - cancer Uncle - MI at 33  Social History: Reviewed history from 05/16/2010 and no changes required. 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  started at age 34. Alcohol use-yes 10 drinks/week Drug use-no Regular exercise-yes pt is single. pt has children.  Review of Systems       The patient complains of shortness of breath with activity, nasal congestion/difficulty breathing through nose, depression, and joint stiffness or pain.  The patient denies shortness of breath at rest, productive cough, non-productive cough, coughing up blood, chest pain, irregular heartbeats, acid heartburn, indigestion, loss of appetite, weight change, abdominal pain, difficulty swallowing, sore throat, tooth/dental problems, headaches, sneezing, itching, ear ache, anxiety, hand/feet swelling, rash, change in color of mucus, and fever.    Vital Signs:  Patient profile:   55 year old male Height:      73.5 inches  Weight:      304.50 pounds BMI:     39.77 O2 Sat:      94 % on Room air Temp:     97.4 degrees F oral Pulse rate:   70 / minute BP sitting:   120 / 50  (left arm) Cuff size:   large  Vitals Entered By: Arman Filter LPN (July 10, 2010 1:38 PM)  O2 Flow:  Room air CC: Sleep Consult Comments Medications reviewed with patient Arman Filter LPN  July 10, 2010 1:38 PM    Physical Exam  General:  ow male in nad Eyes:  PERRLA and EOMI.   Nose:  near obstruction with septal  deviation and increased turbinates bilat. Mouth:  very long uvula and palate, large tongue. Neck:  large neck, hard to assess for jvd, no tmg or LN Lungs:  clear to auscultation Heart:  rrr, no mrg Abdomen:  soft and nontender, bs+ Extremities:  unable to assess secondary to tight fitting boots, difficult to remove. Neurologic:  alert and oriented, mildly sleepy, moves all 4.   Impression & Recommendations:  Problem # 1:  OBSTRUCTIVE SLEEP APNEA (ICD-327.23) the pt has a history of osa which was successfully treated with cpap in the past.  His machine is no longer functioning, and needs to be replaced.  Since not wearing cpap, he has been very symptomatic and wishes to restart treatment.  Will try and find his old sleep study, and hopefully insurance will allow [place] to use this since his weight has actually gone up with persistent symptoms.  I have encouraged him to work aggressively on weight loss.  He will need re-optimization of pressure once we get him a new machine as well.  Other Orders: Consultation Level IV (53664)  Patient Instructions: 1)  we have ordered your sleep study results from storage, and will call you and set up new cpap machine once that is available. 2)  please check you cpap machine, and see if the name of your equipment provider is on this. 3)  work on weight loss 4)  will get you a new cpap machine once we have your sleep study. 5)  followup with me in one year if doing well.

## 2010-12-28 NOTE — Assessment & Plan Note (Signed)
Summary: NEW PATIENT/[facility]   Vital Signs:  Patient profile:   55 year old male Height:      73.5 inches Weight:      296.38 pounds BMI:     38.71 Temp:     98.1 degrees F oral Pulse rate:   68 / minute Pulse rhythm:   regular BP sitting:   130 / 90  (right arm) Cuff size:   regular  Vitals Entered By: Bobby Pena CMA Duncan Dull) (June 23, 2010 10:47 AM) CC: new patient   History of Present Illness: 55 yo here to establish care.  CAD- s/p MI, PTCA/ stents.  Moved her from [place], does not have a cardiologist.  On Plavix 75 mg daily, Ranexa 500 mg two times a day, Metoprolol 25 mg two times a day, Amolodipine 5 mg daily. Does still have anginal symptoms from time to time, just "waits it out."  Physically active with his job. Heavy smoker, h/o HLD.  Uncle had MI at 48.  Tobacco abuse- 2 ppd for most of his adult life.  Tried Chantix, gave him violent dreams.  Ready to quit, knows that given his h/o CAD, this is a very dangerous combination.  HLD- has not had fasting labs in years.  On Simcor 500-40 mg, no recognizable side effects.  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  Now things are much better, successful with new business and reuinted with his highschool sweetheart.  Tried to wean off lexapro unsuccessfully.   Has had issues with ED.  Sleep apnea- had CPAP for years, but the machine is no longer worker.  Needs sleep study referral.  Well man- due for colonoscopy.  Preventive Screening-Counseling & Management  Alcohol-Tobacco     Smoking Cessation Counseling: yes  Current Medications (verified): 1)  Amlodipine Besylate 5 Mg Tabs (Amlodipine Besylate) .... Take One Tablet By Mouth Daily 2)  Plavix 75 Mg Tabs (Clopidogrel Bisulfate) .... Take One Tablet By Mouth Daily 3)  Ranexa 500 Mg Xr12h-Tab (Ranolazine) .Marland Kitchen.. 1 By Mouth Two Times A Day 4)  Lexapro 20 Mg Tabs (Escitalopram Oxalate) .... Take One Tablet By Mouth Daily 5)  Aspirin  325 Mg Tabs (Aspirin) .... Take One Tablet By Mouth Daily 6)  Fexofenadine Hcl 180 Mg Tabs (Fexofenadine Hcl) .... Take One Tablet By Mouth Daily 7)  Metoprolol Tartrate 25 Mg Tabs (Metoprolol Tartrate) .... Take One Tablet By Mouth Two Times A Day 8)  Simcor 500-40 Mg Xr24h-Tab (Niacin-Simvastatin) .... Take One Tablet By Mouth Daily 9)  Zyban 150 Mg Xr12h-Tab (Bupropion Hcl (Smoking Deter)) .Marland Kitchen.. 1 Tab Po Daily X 3 Days; Increase To 1 Tab Po Twice Daily; Treatment Should Continue For 7-12 Weeks.  Allergies (verified): No Known Drug Allergies  Past History:  Past Medical History: Last updated: 05/16/2010 Depression Hyperlipidemia Hypertension Myocardial infarction, hx of Sleep apnea  Past Surgical History: Last updated: 05/16/2010 PTCA/stent  Family History: Last updated: 06/23/2010 Mother: D - cancer Uncle - MI at 26  Social History: Last updated: 05/16/2010 Occupation: Owns a Youth worker company Current Smoker: 2 ppd x 38 yrs Alcohol use-yes 10 drinks/week Drug use-no Regular exercise-yes  Risk Factors: Exercise: yes (05/16/2010)  Risk Factors: Smoking Status: current (05/16/2010)  Family History: Mother: D - cancer Uncle - MI at 61  Review of Systems      See HPI General:  Denies malaise. Eyes:  Denies blurring. ENT:  Denies difficulty swallowing. CV:  Complains of chest  pain or discomfort; denies palpitations, shortness of breath with exertion, and weight gain. Resp:  Denies shortness of breath. GI:  Denies abdominal pain and change in bowel habits. GU:  Complains of erectile dysfunction; denies decreased libido. MS:  Denies joint pain, joint redness, and joint swelling. Derm:  Complains of lesion(s). Neuro:  Denies headaches. Psych:  Denies anxiety and depression. Endo:  Denies cold intolerance and heat intolerance. Heme:  Denies abnormal bruising and bleeding.  Physical Exam  General:  obese, well developed, well nourished, no acute  distress Head:  normocephalic and atraumatic.   Eyes:  vision grossly intact, pupils equal, and pupils round.   Ears:  R ear normal and L ear normal.   Nose:  no external deformity.   Mouth:  Oral mucosa and oropharynx without lesions or exudates.  Teeth in good repair. Neck:  No deformities, masses, or tenderness noted. Lungs:  Normal respiratory effort, chest expands symmetrically. Lungs are clear to auscultation, no crackles or wheezes. Heart:  normal rate, regular rhythm, and no murmur.   Abdomen:  soft and non-tender.   Msk:  normal ROM.   Extremities:  no edema Neurologic:  alert & oriented X3 and gait normal.   Skin:  actinic keratosis and nevus.   Psych:  Cognition and judgment appear intact. Alert and cooperative with normal attention span and concentration. No apparent delusions, illusions, hallucinations   Impression & Recommendations:  Problem # 1:  MYOCARDIAL INFARCTION, HX OF (ICD-412) Assessment Unchanged Will refer to establish with local cardiologist.  Strongly counselled on smoking cessation (see below).  Recheck FLP, continue meds.   His updated medication list for this problem includes:    Amlodipine Besylate 5 Mg Tabs (Amlodipine besylate) .Marland Kitchen... Take one tablet by mouth daily    Plavix 75 Mg Tabs (Clopidogrel bisulfate) .Marland Kitchen... Take one tablet by mouth daily    Ranexa 500 Mg Xr12h-tab (Ranolazine) .Marland Kitchen... 1 by mouth two times a day    Aspirin 325 Mg Tabs (Aspirin) .Marland Kitchen... Take one tablet by mouth daily    Metoprolol Tartrate 25 Mg Tabs (Metoprolol tartrate) .Marland Kitchen... Take one tablet by mouth two times a day  Orders: Cardiology Referral (Cardiology)  Problem # 2:  TOBACCO ABUSE (ICD-305.1) Assessment: Unchanged ready to quit.  Will attempt Zyban.  Follow up with me in 1 month. His updated medication list for this problem includes:    Zyban 150 Mg Xr12h-tab (Bupropion hcl (smoking deter)) .Marland Kitchen... 1 tab po daily x 3 days; increase to 1 tab po twice daily; treatment should  continue for 7-12 weeks.  Problem # 3:  COLONIC POLYPS, HX OF (ICD-V12.72) Assessment: Unchanged refer to GI for screening colonoscopy (due based on age).  Problem # 4:  NEVUS (ICD-216.9) Assessment: Unchanged multiple nevi and AK, pt would like derm referral. Orders: Dermatology Referral (Derma)  Problem # 5:  DEPRESSION (ICD-311) Assessment: Unchanged stable, but now concerned about ED.  Explained importance of weight loss and managing his chronic medical problems to help address this issue.  Will also check testosterone level. His updated medication list for this problem includes:    Lexapro 20 Mg Tabs (Escitalopram oxalate) .Marland Kitchen... Take one tablet by mouth daily  Orders: Venipuncture (16109) TLB-Testosterone, Total (84403-TESTO)  Problem # 6:  OBSTRUCTIVE SLEEP APNEA (ICD-327.23) Assessment: Unchanged Refer to pulm for sleep study and CPAP.  Problem # 7:  HYPERLIPIDEMIA (ICD-272.4) Assessment: Unchanged  His updated medication list for this problem includes:    Simcor 500-40 Mg Xr24h-tab (Niacin-simvastatin) .Marland Kitchen... Take  one tablet by mouth daily  Orders: TLB-Lipid Panel (80061-LIPID) Venipuncture (52841)  Problem # 8:  HYPERTENSION (ICD-401.9) Assessment: Unchanged Stable, refilled meds. His updated medication list for this problem includes:    Amlodipine Besylate 5 Mg Tabs (Amlodipine besylate) .Marland Kitchen... Take one tablet by mouth daily    Metoprolol Tartrate 25 Mg Tabs (Metoprolol tartrate) .Marland Kitchen... Take one tablet by mouth two times a day  Orders: TLB-BMP (Basic Metabolic Panel-BMET) (80048-METABOL) Venipuncture (32440)  Complete Medication List: 1)  Amlodipine Besylate 5 Mg Tabs (Amlodipine besylate) .... Take one tablet by mouth daily 2)  Plavix 75 Mg Tabs (Clopidogrel bisulfate) .... Take one tablet by mouth daily 3)  Ranexa 500 Mg Xr12h-tab (Ranolazine) .Marland Kitchen.. 1 by mouth two times a day 4)  Lexapro 20 Mg Tabs (Escitalopram oxalate) .... Take one tablet by mouth  daily 5)  Aspirin 325 Mg Tabs (Aspirin) .... Take one tablet by mouth daily 6)  Fexofenadine Hcl 180 Mg Tabs (Fexofenadine hcl) .... Take one tablet by mouth daily 7)  Metoprolol Tartrate 25 Mg Tabs (Metoprolol tartrate) .... Take one tablet by mouth two times a day 8)  Simcor 500-40 Mg Xr24h-tab (Niacin-simvastatin) .... Take one tablet by mouth daily 9)  Zyban 150 Mg Xr12h-tab (Bupropion hcl (smoking deter)) .Marland Kitchen.. 1 tab po daily x 3 days; increase to 1 tab po twice daily; treatment should continue for 7-12 weeks.  Other Orders: Gastroenterology Referral (GI) TLB-Hepatic/Liver Function Pnl (80076-HEPATIC) Pulmonary Referral (Pulmonary)  Patient Instructions: 1)  Great to meet you, Bobby Pena. 2)  Please stop by to see Shirlee Limerick on your way out to set up your referrals. Prescriptions: SIMCOR 500-40 MG XR24H-TAB (NIACIN-SIMVASTATIN) take one tablet by mouth daily  #60 x 6   Entered and Authorized by:   Ruthe Mannan MD   Signed by:   Ruthe Mannan MD on 06/23/2010   Method used:   Electronically to        [facility] [address] (retail)       [address], [address]       [place], [place]  [address]       Ph: [phone]       Fax: [phone]   RxID:   [id number] METOPROLOL TARTRATE 25 MG TABS (METOPROLOL TARTRATE) take one tablet by mouth two times a day  #60 x 6   Entered and Authorized by:   Ruthe Mannan MD   Signed by:   Ruthe Mannan MD on 06/23/2010   Method used:   Electronically to        [facility] [address] (retail)       [address], [address]       [place], [place]  [address]       Ph: [phone]       Fax: [phone]   RxID:   [id number] LEXAPRO 20 MG TABS (ESCITALOPRAM OXALATE) take one tablet by mouth daily  #60 x 6   Entered and Authorized by:   Ruthe Mannan MD   Signed by:   Ruthe Mannan MD on 06/23/2010   Method used:   Electronically to        [facility] [address] (retail)       [address]       [place], [place]  [address]  Ph: [phone]       Fax: [phone]   RxID:   [id number] RANEXA 500 MG XR12H-TAB (RANOLAZINE) 1 by mouth two times a day  #60 x 6   Entered and Authorized by:   Ruthe Mannan MD   Signed by:   Ruthe Mannan MD on 06/23/2010   Method used:   Electronically to        [facility] [address] (retail)       [address], [address]       [place], [place]  [address]       Ph: [phone]       Fax: [phone]   RxID:   [id number] PLAVIX 75 MG TABS (CLOPIDOGREL BISULFATE) take one tablet by mouth daily  #60 x 6   Entered and Authorized by:   Ruthe Mannan MD   Signed by:   Ruthe Mannan MD on 06/23/2010   Method used:   Electronically to        [facility] [address] (retail)       [address], [address]       [place], [place]  [address]       Ph: [phone]       Fax: [phone]   RxID:   [id number] AMLODIPINE BESYLATE 5 MG TABS (AMLODIPINE BESYLATE) take one tablet by mouth daily  #60 x 6   Entered and Authorized by:   Ruthe Mannan MD   Signed by:   Ruthe Mannan MD on 06/23/2010   Method used:   Electronically to        [facility] [address] (retail)       [address], [address]       [place], [place]  [address]       Ph: [phone]       Fax: [phone]   RxID:   [id number] ZYBAN 150 MG XR12H-TAB (BUPROPION HCL (SMOKING DETER)) 1 tab po daily x 3 days; increase to 1 tab po twice daily; treatment should continue for 7-12 weeks.  #60 x 3   Entered and Authorized by:   Ruthe Mannan MD   Signed by:   Ruthe Mannan MD on 06/23/2010   Method used:   Electronically to        [facility] [address] (retail)       [address]       [place], [place]  [address]       Ph: [phone]       Fax: [phone]   RxID:   [id number]   Current Allergies  (reviewed today): No known allergies   Prevention & Chronic Care Immunizations   Influenza vaccine: Not documented    Tetanus booster: Not documented    Pneumococcal vaccine: Not documented  Colorectal Screening   Hemoccult: Not documented    Colonoscopy: Not documented   Colonoscopy action/deferral: GI referral  (06/23/2010)  Other Screening   PSA: Not documented   Smoking status: current  (05/16/2010)   Smoking cessation counseling: yes  (06/23/2010)  Lipids   Total Cholesterol: Not documented   Lipid panel action/deferral: Lipid Panel ordered   LDL: Not documented   LDL Direct: Not documented   HDL: Not documented   Triglycerides: Not documented    SGOT (  AST): Not documented   BMP action: Ordered   SGPT (ALT): Not documented   Alkaline phosphatase: Not documented   Total bilirubin: Not documented  Hypertension   Last Blood Pressure: 130 / 90  (06/23/2010)   Serum creatinine: Not documented   BMP action: Ordered   Serum potassium Not documented  Self-Management Support :    Hypertension self-management support: Not documented    Lipid self-management support: Not documented    Nursing Instructions: GI referral for screening colonoscopy (see order)

## 2010-12-28 NOTE — Consult Note (Signed)
Summary: [facility]   [facility]   Imported By: Maryln Gottron 08/28/2010 15:18:20  _____________________________________________________________________  External Attachment:    Type:   Image     Comment:   External Document

## 2010-12-28 NOTE — Progress Notes (Signed)
Summary: Nuc Pre-Procedure  Phone Note Outgoing Call Call back at [facility] Phone [phone]   Call placed by: Antionette Char RN,  August 09, 2010 2:59 PM Call placed to: Patient Reason for Call: Confirm/change Appt Summary of Call: Reviewed information on Myoview Information Sheet (see scanned document for further details).  Spoke with patient.     Nuclear Med Background Indications for Stress Test: Evaluation for Ischemia, Stent Patency   History: Angioplasty, Asthma, COPD, Heart Catheterization, Myocardial Infarction, Myocardial Perfusion Study, Stents  History Comments: 04 MPS- mild inferior ischemia, Nml EF  Symptoms: DOE, SOB    Nuclear Pre-Procedure Cardiac Risk Factors: Hypertension, Lipids, Smoker Height (in): 73

## 2011-01-01 ENCOUNTER — Ambulatory Visit (INDEPENDENT_AMBULATORY_CARE_PROVIDER_SITE_OTHER)
Admission: RE | Admit: 2011-01-01 | Discharge: 2011-01-01 | Disposition: A | Discharge status: Home / Self Care | Payer: 59 | Source: Ambulatory Visit | Attending: Family Medicine | Admitting: Family Medicine

## 2011-01-01 ENCOUNTER — Other Ambulatory Visit: Payer: Self-pay | Admitting: Family Medicine

## 2011-01-01 ENCOUNTER — Encounter: Payer: Self-pay | Admitting: Family Medicine

## 2011-01-01 ENCOUNTER — Ambulatory Visit (INDEPENDENT_AMBULATORY_CARE_PROVIDER_SITE_OTHER): Payer: 59 | Admitting: Family Medicine

## 2011-01-01 DIAGNOSIS — M25569 Pain in unspecified knee: Secondary | ICD-10-CM

## 2011-01-01 DIAGNOSIS — M1710 Unilateral primary osteoarthritis, unspecified knee: Secondary | ICD-10-CM | POA: Insufficient documentation

## 2011-01-03 NOTE — Progress Notes (Signed)
Summary: ??able to do cardiac rehab  Phone Note Call from Patient   Caller: Patient's wife  Call For: Dr. Patsy Lager  Summary of Call: Patient saw you on 12-21-10 for back pain. His wife is caling today becuase patient's cardiologist did order for him to have cardiac rehab.  After patient spoke with cardiologist he was told to call our office to see if you think he is capable to do it right now or if he should wait a while. Please advise.  Initial call taken by: Melody Comas,  December 27, 2010 4:49 PM  Follow-up for Phone Call        when i saw him, no. if he is better, ok to do it.  d/w patient - he is pretty knowledgable and I think he can make that call. if he is hurting a lot, obviously he should hold off.   regardless, i don't think he would do himself significant harm since  he has had back problems for 10-15 years. Follow-up by: Hannah Beat MD,  December 28, 2010 7:01 AM  Additional Follow-up for Phone Call Additional follow up Details #1::        left message for patient to return my call Additional Follow-up by: Benny Lennert CMA Duncan Dull),  December 28, 2010 7:54 AM    Additional Follow-up for Phone Call Additional follow up Details #2::    Patient advsied.Consuello Masse CMA   Follow-up by: Benny Lennert CMA Duncan Dull),  December 28, 2010 9:20 AM

## 2011-01-03 NOTE — Progress Notes (Signed)
Summary: Triage  Phone Note Call from Patient Call back at [phone]   Caller: Patients caregiver Call For: Dr. Russella Dar Reason for Call: Talk to Nurse Summary of Call: [name] is calling to request the patient have and EGD because he is having heartburn and patient will not call  Initial call taken by: [place] Johnson,  December 28, 2010 10:48 AM  Follow-up for Phone Call        I have advised [name] that I don't see a release in his chart to discuss his medical care with her.  I have however scehduled an appointment for him for 01/11/11 11:30 to see Dr Russella Dar. Follow-up by: Darcey Nora RN, CGRN,  December 28, 2010 11:12 AM

## 2011-01-03 NOTE — Progress Notes (Signed)
Summary: several concerns  Phone Note Call from Patient Call back at [phone]   Caller: Significant Other [name] Summary of Call: Pt's girlfriend is asking if pt should have a chest CT, because he has been a smoker for several years- he has cough, shortness of breath.  Also, pt wants thyroid, testosterone, and CRT checked with labs.  [name] is asking about a blood test that will check for elevated levels of a certain antibody that causes inflammation due to dairy allergy.   She wants that checked also.  He missed his lab appt on 1/31 but will reschedule. Initial call taken by: Lowella Petties CMA, AAMA,  December 28, 2010 11:02 AM  Follow-up for Phone Call        I'm really confused by this message.  I think he needs to schedule an appointment to discuss.  We cannot order a chest Ct without evaluating him. Ruthe Mannan MD  December 28, 2010 11:03 AM  [facility] for pt to call to schedule appt.              Lowella Petties CMA, AAMA  December 28, 2010 11:10 AM

## 2011-01-08 ENCOUNTER — Other Ambulatory Visit: Payer: Self-pay | Admitting: Family Medicine

## 2011-01-08 ENCOUNTER — Encounter: Payer: Self-pay | Admitting: Family Medicine

## 2011-01-08 ENCOUNTER — Telehealth (INDEPENDENT_AMBULATORY_CARE_PROVIDER_SITE_OTHER): Payer: Self-pay | Admitting: *Deleted

## 2011-01-08 ENCOUNTER — Ambulatory Visit (INDEPENDENT_AMBULATORY_CARE_PROVIDER_SITE_OTHER): Payer: 59 | Admitting: Family Medicine

## 2011-01-08 DIAGNOSIS — E785 Hyperlipidemia, unspecified: Secondary | ICD-10-CM

## 2011-01-08 DIAGNOSIS — E291 Testicular hypofunction: Secondary | ICD-10-CM

## 2011-01-08 DIAGNOSIS — R05 Cough: Secondary | ICD-10-CM

## 2011-01-08 DIAGNOSIS — R5381 Other malaise: Secondary | ICD-10-CM

## 2011-01-08 DIAGNOSIS — R5383 Other fatigue: Secondary | ICD-10-CM

## 2011-01-08 DIAGNOSIS — I10 Essential (primary) hypertension: Secondary | ICD-10-CM

## 2011-01-08 LAB — LIPID PANEL
Cholesterol: 281 mg/dL — ABNORMAL HIGH (ref 0–200)
HDL: 43 mg/dL (ref >39.00)
VLDL: 93.4 mg/dL — ABNORMAL HIGH (ref 0.0–40.0)

## 2011-01-08 LAB — BASIC METABOLIC PANEL
BUN: 21 mg/dL (ref 6–23)
Calcium: 9.7 mg/dL (ref 8.4–10.5)
Creatinine, Ser: 1.1 mg/dL (ref 0.4–1.5)
GFR: 71.97 mL/min (ref >60.00)
Potassium: 4.6 mEq/L (ref 3.5–5.1)

## 2011-01-08 LAB — HEPATIC FUNCTION PANEL
ALT: 51 U/L (ref 0–53)
AST: 30 U/L (ref 0–37)
Albumin: 4.3 g/dL (ref 3.5–5.2)
Total Bilirubin: 0.6 mg/dL (ref 0.3–1.2)

## 2011-01-08 LAB — TSH: TSH: 1.75 u[IU]/mL (ref 0.35–5.50)

## 2011-01-08 LAB — B12 AND FOLATE PANEL: Vitamin B-12: 367 pg/mL (ref 211–911)

## 2011-01-08 LAB — LDL CHOLESTEROL, DIRECT: Direct LDL: 185.5 mg/dL

## 2011-01-09 ENCOUNTER — Encounter (HOSPITAL_COMMUNITY): Payer: Self-pay | Attending: Cardiovascular Disease

## 2011-01-09 DIAGNOSIS — Z5189 Encounter for other specified aftercare: Secondary | ICD-10-CM | POA: Insufficient documentation

## 2011-01-09 DIAGNOSIS — F329 Major depressive disorder, single episode, unspecified: Secondary | ICD-10-CM | POA: Insufficient documentation

## 2011-01-09 DIAGNOSIS — E785 Hyperlipidemia, unspecified: Secondary | ICD-10-CM | POA: Insufficient documentation

## 2011-01-09 DIAGNOSIS — G4733 Obstructive sleep apnea (adult) (pediatric): Secondary | ICD-10-CM | POA: Insufficient documentation

## 2011-01-09 DIAGNOSIS — R0602 Shortness of breath: Secondary | ICD-10-CM | POA: Insufficient documentation

## 2011-01-09 DIAGNOSIS — I252 Old myocardial infarction: Secondary | ICD-10-CM | POA: Insufficient documentation

## 2011-01-09 DIAGNOSIS — K219 Gastro-esophageal reflux disease without esophagitis: Secondary | ICD-10-CM | POA: Insufficient documentation

## 2011-01-09 DIAGNOSIS — I10 Essential (primary) hypertension: Secondary | ICD-10-CM | POA: Insufficient documentation

## 2011-01-09 DIAGNOSIS — F3289 Other specified depressive episodes: Secondary | ICD-10-CM | POA: Insufficient documentation

## 2011-01-09 DIAGNOSIS — I2510 Atherosclerotic heart disease of native coronary artery without angina pectoris: Secondary | ICD-10-CM | POA: Insufficient documentation

## 2011-01-11 ENCOUNTER — Encounter: Payer: Self-pay | Admitting: Gastroenterology

## 2011-01-11 ENCOUNTER — Ambulatory Visit (INDEPENDENT_AMBULATORY_CARE_PROVIDER_SITE_OTHER): Payer: 59 | Admitting: Gastroenterology

## 2011-01-11 ENCOUNTER — Encounter (HOSPITAL_COMMUNITY): Payer: Self-pay

## 2011-01-11 DIAGNOSIS — K219 Gastro-esophageal reflux disease without esophagitis: Secondary | ICD-10-CM | POA: Insufficient documentation

## 2011-01-11 DIAGNOSIS — Z86010 Personal history of colonic polyps: Secondary | ICD-10-CM

## 2011-01-11 NOTE — Assessment & Plan Note (Signed)
Summary: BACK,KNEE PAIN/CLE    Vital Signs:  Patient profile:   55 year old male Height:      77 inches Weight:      302.50 pounds BMI:     36.00 Temp:     98.0 degrees F oral Pulse rate:   72 / minute Pulse rhythm:   regular BP sitting:   130 / 70  (left arm) Cuff size:   large  Vitals Entered By: Benny Lennert CMA Duncan Dull) (January 01, 2011 3:29 PM)  History of Present Illness: Chief complaint knee pain  55 year old male:  In hs, cracked his left knee, had maybe 20 injections.   pleasant gentleman, I remember him well. He presents with left-sided knee pain is ongoing intermittently for 10-20 years, but has had a flareup in the last several months. He had left knee arthroscopy 2 years ago, has done fairly well since then. It seems as if he had probable removal of a loose body and potentially partial meniscectomy, but operative details are not entirely clear.  Did have locking up of his knee multiple months ago, but has not had this recur. No symptomatic giving way, but he does have worsening pain when going up stairs, less pain when going downstairs. Mild effusion.  Going to start some cardiac rehab.   REVIEW OF SYSTEMS  GEN: No systemic complaints, no fevers, chills, sweats, or other acute illnesses MSK: Detailed in the HPI, back pain is now essentially completely resolved, status post do manipulations by his chiropractor. GI: tolerating PO intake without difficulty Neuro: No numbness, parasthesias, or tingling associated. Otherwise the pertinent positives of the ROS are noted above.    Allergies: 1)  ! Chantix  Past History:  Past medical, surgical, family and social histories (including risk factors) reviewed, and no changes noted (except as noted below).  Past Medical History: Reviewed history from 11/07/2010 and no changes required. HYPERTENSION HYPERLIPIDEMIA  MYOCARDIAL INFARCTION, HX OF  OTHER SPECIFIED ENDOCRINE DISORDERS TOBACCO ABUSE  COLONIC POLYPS,  HX OF  NEVUS SLEEP APNEA  OBSTRUCTIVE SLEEP APNEA  DEPRESSION Arthritis Asthma Congestive Heart Failure  Past Surgical History: Reviewed history from 11/02/2010 and no changes required. PTCA/stent   2004 tonsillectomy nasal surgery B shoulder surgeries B knee surgeries ankle surgery Umbilical hernia repair  Family History: Reviewed history from 11/07/2010 and no changes required. Mother: D - cancer  Lung Uncle - MI at 12 No FH of Colon Cancer:  Social History: Reviewed history from 07/10/2010 and no changes required. 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  started at age 27. Alcohol use-yes 10 drinks/week Drug use-no Regular exercise-yes pt is single. pt has children.  Physical Exam  General:  GEN: Well-developed,well-nourished,in no acute distress; alert,appropriate and cooperative throughout examination HEENT: Normocephalic and atraumatic without obvious abnormalities. No apparent alopecia or balding. Ears, externally no deformities PULM: Breathing comfortably in no respiratory distress EXT: No clubbing, cyanosis, or edema PSYCH: Normally interactive. Cooperative during the interview. Pleasant. Friendly and conversant. Not anxious or depressed appearing. Normal, full affect.  Msk:  Gait: Normal heel toe pattern ROM: WNL Effusion: mild Echymosis or edema: none Patellar tendon NT Painful PLICA: neg Patellar grind: negative Medial and lateral patellar facet loading: negative medial and lateral joint lines: mild medial joint line pain Mcmurray's neg Flexion-pinch pos Varus and valgus stress: stable Lachman: neg Ant and Post drawer: neg Hip abduction, IR, ER: WNL Hip flexion str: 5/5 Hip abd: 5/5 Quad: 5/5 VMO atrophy:No Hamstring  concentric and eccentric: 5/5    Impression & Recommendations:  Problem # 1:  OSTEOARTHRITIS, KNEE, LEFT, MILD (ICD-715.96) Assessment New  oa exacerbation celebrex given he is on plavix and steroid  injection to try to calm down before cardiac rehab.  Knee Injection Patient verbally consented to procedure. Risks, benefits, and alternatives explained. Sterilely prepped with betadine. Ethyl cholride used for anesthesia. 9 cc Lidocaine 1% mixed with 1 cc of Kenalog 40 mg injected using the anterolateral approach without difficulty. No complications with procedure and tolerated well. Patient had decreased pain post-injection.   His updated medication list for this problem includes:    Aspirin 325 Mg Tabs (Aspirin) .Marland Kitchen... Take one tablet by mouth daily    Hydrocodone-acetaminophen 5-500 Mg Tabs (Hydrocodone-acetaminophen) .Marland Kitchen... 1 - 2 by mouth q 6 hours as needed pain    Cyclobenzaprine Hcl 10 Mg Tabs (Cyclobenzaprine hcl) .Marland Kitchen... 1 by mouth 3  times daily as needed for back pain    Celebrex 200 Mg Caps (Celecoxib) .Marland Kitchen... 1 by mouth daily (on plavix)  Orders: Joint Aspirate / Injection, Large (20610) Kenalog 10mg  (4units) (J3301)  Problem # 2:  KNEE PAIN, LEFT (ICD-719.46) X-rays: AP Bilateral Weight-bearing, Weightbearing Lateral, Sunrise views Indication: knee pain Findings:  mild flattening of the medial femoral condyle compared to the contralateral side. There is also a lateral large osteophyte on the sunrise view in the patellofemoral joint. Otherwise, grossly unremarkable.  His updated medication list for this problem includes:    Aspirin 325 Mg Tabs (Aspirin) .Marland Kitchen... Take one tablet by mouth daily    Hydrocodone-acetaminophen 5-500 Mg Tabs (Hydrocodone-acetaminophen) .Marland Kitchen... 1 - 2 by mouth q 6 hours as needed pain    Cyclobenzaprine Hcl 10 Mg Tabs (Cyclobenzaprine hcl) .Marland Kitchen... 1 by mouth 3  times daily as needed for back pain    Celebrex 200 Mg Caps (Celecoxib) .Marland Kitchen... 1 by mouth daily (on plavix)  Orders: T-Knee Left 2 view (73560TC) T-DG Knee Bilateral Standing AP ([address]) Joint Aspirate / Injection, Large (20610) Kenalog 10mg  (4units) (J3301)  Complete Medication List: 1)  Amlodipine  Besylate 5 Mg Tabs (Amlodipine besylate) .... Take one tablet by mouth daily 2)  Plavix 75 Mg Tabs (Clopidogrel bisulfate) .... Take one tablet by mouth daily 3)  Ranexa 500 Mg Xr12h-tab (Ranolazine) .Marland Kitchen.. 1 by mouth two times a day 4)  Lexapro 20 Mg Tabs (Escitalopram oxalate) .... Take one tablet by mouth daily 5)  Aspirin 325 Mg Tabs (Aspirin) .... Take one tablet by mouth daily 6)  Fexofenadine Hcl 180 Mg Tabs (Fexofenadine hcl) .... Take one tablet by mouth daily 7)  Metoprolol Tartrate 25 Mg Tabs (Metoprolol tartrate) .... Take one tablet by mouth once daily 8)  Zyban 150 Mg Xr12h-tab (Bupropion hcl (smoking deter)) .Marland Kitchen.. 1 tab po daily x 3 days; increase to 1 tab po twice daily; treatment should continue for 7-12 weeks. 9)  Androgel Pump 1 % Gel (Testosterone) .... 5 g applied once daily (preferably in the morning) to the shoulder and upper arms 10)  Singulair 10 Mg Tabs (Montelukast sodium) .Marland Kitchen.. 1 by mouth daily 11)  Crestor 10 Mg Tabs (Rosuvastatin calcium) .Marland Kitchen.. 1 tablet by mouth daily 12)  Gemfibrozil 600 Mg Tabs (Gemfibrozil) .Marland Kitchen.. 1 tab by mouth two times a day - 30 minutes prior to breakfast and dinner. 13)  Hydrocodone-acetaminophen 5-500 Mg Tabs (Hydrocodone-acetaminophen) .Marland Kitchen.. 1 - 2 by mouth q 6 hours as needed pain 14)  Cyclobenzaprine Hcl 10 Mg Tabs (Cyclobenzaprine hcl) .Marland Kitchen.. 1 by mouth 3  times daily as needed for back pain 15)  Celebrex 200 Mg Caps (Celecoxib) .Marland Kitchen.. 1 by mouth daily (on plavix) Prescriptions: CELEBREX 200 MG CAPS (CELECOXIB) 1 by mouth daily (on plavix)  #30 x 5   Entered and Authorized by:   Hannah Beat MD   Signed by:   Hannah Beat MD on 01/01/2011   Method used:   Electronically to        [facility]* (retail)       [address]       [place], [place]  [address]       Ph: [phone]       Fax: [phone]   RxID:   [id number]    Orders Added: 1)  T-Knee Left 2 view [73560TC] 2)  T-DG Knee Bilateral Standing AP [73565] 3)  Est.  Patient Level IV [84132] 4)  Joint Aspirate / Injection, Large [20610] 5)  Kenalog 10mg  (4units) [J3301]    Current Allergies (reviewed today): ! CHANTIX

## 2011-01-12 ENCOUNTER — Ambulatory Visit (INDEPENDENT_AMBULATORY_CARE_PROVIDER_SITE_OTHER)
Admission: RE | Admit: 2011-01-12 | Discharge: 2011-01-12 | Disposition: A | Discharge status: Home / Self Care | Payer: 59 | Source: Ambulatory Visit | Attending: Family Medicine | Admitting: Family Medicine

## 2011-01-12 ENCOUNTER — Telehealth: Payer: Self-pay | Admitting: Family Medicine

## 2011-01-12 ENCOUNTER — Encounter (HOSPITAL_COMMUNITY): Payer: Self-pay

## 2011-01-12 ENCOUNTER — Telehealth: Payer: Self-pay | Admitting: Pulmonary Disease

## 2011-01-12 DIAGNOSIS — R05 Cough: Secondary | ICD-10-CM

## 2011-01-14 ENCOUNTER — Encounter: Payer: Self-pay | Admitting: Pulmonary Disease

## 2011-01-16 ENCOUNTER — Encounter (HOSPITAL_COMMUNITY): Payer: Self-pay

## 2011-01-17 NOTE — Progress Notes (Signed)
Summary: fyi to Dr. Dayton Martes.  Phone Note Call from Patient   Summary of Call: Family member called says they scheduled an appt w/ the Cardiologist for Feb 29th at 9am...wanted to make sure Dr Dayton Martes is aware.Daine Gip  January 12, 2011 3:01 PM  Initial call taken by: Daine Gip,  January 12, 2011 3:02 PM  Follow-up for Phone Call        thank you. Ruthe Mannan MD  January 12, 2011 3:04 PM

## 2011-01-17 NOTE — Assessment & Plan Note (Signed)
Summary: OV TO DISCUSS HAVING LABS DONE/CLE  [facility]   Vital Signs:  Patient profile:   55 year old male Height:      77 inches Weight:      303.50 pounds BMI:     36.12 Temp:     98.1 degrees F oral Pulse rate:   73 / minute Pulse rhythm:   regular BP sitting:   120 / 82  (right arm) Cuff size:   large  Vitals Entered By: Linde Gillis CMA Duncan Dull) (January 08, 2011 8:21 AM) CC: follow up    History of Present Illness: 55 yo here to discuss multiple concerns.  HLD- lipids remain extremely elevated, espcially TG.   In 10/2010, TG 532, HDL 37, LDL 112.  Added Gemfibrozil 600 mg bid to Crestor 10 mg daily.  He is a very high risk patient, with CAD s/p MI, PTCA/stents, heavy smoker.  Family h/o CAD.   Pt wants a CT scan. Negative CXR in 11/11.  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  Chronic cough.   No weight loss, night sweats, fevers or chest pain.    Current Medications (verified): 1)  Amlodipine Besylate 5 Mg Tabs (Amlodipine Besylate) .... Take One Tablet By Mouth Daily 2)  Plavix 75 Mg Tabs (Clopidogrel Bisulfate) .... Take One Tablet By Mouth Daily 3)  Ranexa 500 Mg Xr12h-Tab (Ranolazine) .Marland Kitchen.. 1 By Mouth Two Times A Day 4)  Lexapro 20 Mg Tabs (Escitalopram Oxalate) .... Take One Tablet By Mouth Daily 5)  Aspirin 325 Mg Tabs (Aspirin) .... Take One Tablet By Mouth Daily 6)  Fexofenadine Hcl 180 Mg Tabs (Fexofenadine Hcl) .... Take One Tablet By Mouth Daily 7)  Metoprolol Tartrate 25 Mg Tabs (Metoprolol Tartrate) .... Take One Tablet By Mouth Once Daily 8)  Zyban 150 Mg Xr12h-Tab (Bupropion Hcl (Smoking Deter)) .Marland Kitchen.. 1 Tab Po Daily X 3 Days; Increase To 1 Tab Po Twice Daily; Treatment Should Continue For 7-12 Weeks. 9)  Androgel Pump 1 % Gel (Testosterone) .... 5 G Applied Once Daily (Preferably in The Morning) To The Shoulder and Upper Arms 10)  Singulair 10 Mg Tabs  (Montelukast Sodium) .Marland Kitchen.. 1 By Mouth Daily 11)  Crestor 10 Mg Tabs (Rosuvastatin Calcium) .Marland Kitchen.. 1 Tablet By Mouth Daily 12)  Gemfibrozil 600 Mg Tabs (Gemfibrozil) .Marland Kitchen.. 1 Tab By Mouth Two Times A Day - 30 Minutes Prior To Breakfast and Dinner. 13)  Hydrocodone-Acetaminophen 5-500 Mg Tabs (Hydrocodone-Acetaminophen) .Marland Kitchen.. 1 - 2 By Mouth Q 6 Hours As Needed Pain 14)  Cyclobenzaprine Hcl 10 Mg  Tabs (Cyclobenzaprine Hcl) .Marland Kitchen.. 1 By Mouth 3  Times Daily As Needed For Back Pain 15)  Celebrex 200 Mg Caps (Celecoxib) .Marland Kitchen.. 1 By Mouth Daily (On Plavix)  Allergies: 1)  ! Chantix  Past History:  Past Medical History: Last updated: 01/05/2011 HYPERTENSION HYPERLIPIDEMIA  MYOCARDIAL INFARCTION, HX OF  OTHER SPECIFIED ENDOCRINE DISORDERS TOBACCO ABUSE  COLONIC POLYPS, HX OF  NEVUS SLEEP APNEA  OBSTRUCTIVE SLEEP APNEA  DEPRESSION Arthritis Asthma Congestive Heart Failure Adenomatous Colon Polyps 10/2010  Past Surgical History: Last updated: 11/02/2010 PTCA/stent   2004 tonsillectomy nasal surgery B shoulder surgeries B knee surgeries ankle surgery Umbilical hernia repair  Family History: Last updated: 11/07/2010 Mother: D - cancer  Lung Uncle - MI at 34 No FH of Colon Cancer:  Social History: Last updated: 07/10/2010 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  started at age 24. Alcohol use-yes 10 drinks/week Drug use-no Regular exercise-yes pt is single. pt has children.  Risk Factors: Exercise: yes (05/16/2010)  Risk Factors: Smoking Status: current (05/16/2010)  Review of Systems      See HPI General:  Complains of fatigue. Eyes:  Denies blurring. ENT:  Denies difficulty swallowing. CV:  Denies chest pain or discomfort. Resp:  Complains of cough and shortness of breath; denies sputum productive and wheezing. GI:  Denies abdominal pain, bloody stools, and change in bowel habits. GU:  Denies discharge and dysuria. MS:  Denies joint pain, joint  redness, and joint swelling. Derm:  Denies rash. Neuro:  Denies headaches. Psych:  Denies anxiety and depression. Endo:  Denies cold intolerance and heat intolerance. Heme:  Denies abnormal bruising and bleeding.  Physical Exam  General:  GEN: Well-developed,well-nourished,in no acute distress; alert,appropriate and cooperative throughout examination HEENT: Normocephalic and atraumatic without obvious abnormalities. No apparent alopecia or balding. Ears, externally no deformities PULM: Breathing comfortably in no respiratory distress EXT: No clubbing, cyanosis, or edema PSYCH: Normally interactive. Cooperative during the interview. Pleasant. Friendly and conversant. Not anxious or depressed appearing. Normal, full affect.    Impression & Recommendations:  Problem # 1:  FATIGUE (ICD-780.79) Assessment Deteriorated Likely due to deconditioning, smoking and weight gain. Discussed lifestyle modification and smoking cessation again.  He is starting cardiac rehab tomorrow and feels like he could quit smoking soon. TSH, B12/Folate. Orders: TLB-TSH (Thyroid Stimulating Hormone) (84443-TSH) TLB-B12 + Folate Pnl ([id number]-G40/NUU)  Problem # 2:  OTHER TESTICULAR HYPOFUNCTION (ICD-257.2) Assessment: Unchanged recheck testosterone, CBC.  Problem # 3:  COUGH (ICD-786.2) Assessment: Unchanged CT scan of chest without contrast. Orders: Radiology Referral (Radiology)  Problem # 4:  HYPERLIPIDEMIA (ICD-272.4) Assessment: Unchanged recheck lipid panel and hepatic panel today. His updated medication list for this problem includes:    Crestor 10 Mg Tabs (Rosuvastatin calcium) .Marland Kitchen... 1 tablet by mouth daily    Gemfibrozil 600 Mg Tabs (Gemfibrozil) .Marland Kitchen... 1 tab by mouth two times a day - 30 minutes prior to breakfast and dinner.  Orders: TLB-Lipid Panel (80061-LIPID) TLB-Hepatic/Liver Function Pnl (80076-HEPATIC)  Complete Medication List: 1)  Amlodipine Besylate 5 Mg Tabs (Amlodipine  besylate) .... Take one tablet by mouth daily 2)  Plavix 75 Mg Tabs (Clopidogrel bisulfate) .... Take one tablet by mouth daily 3)  Ranexa 500 Mg Xr12h-tab (Ranolazine) .Marland Kitchen.. 1 by mouth two times a day 4)  Lexapro 20 Mg Tabs (Escitalopram oxalate) .... Take one tablet by mouth daily 5)  Aspirin 325 Mg Tabs (Aspirin) .... Take one tablet by mouth daily 6)  Fexofenadine Hcl 180 Mg Tabs (Fexofenadine hcl) .... Take one tablet by mouth daily 7)  Metoprolol Tartrate 25 Mg Tabs (Metoprolol tartrate) .... Take one tablet by mouth once daily 8)  Zyban 150 Mg Xr12h-tab (Bupropion hcl (smoking deter)) .Marland Kitchen.. 1 tab po daily x 3 days; increase to 1 tab po twice daily; treatment should continue for 7-12 weeks. 9)  Androgel Pump 1 % Gel (Testosterone) .... 5 g applied once daily (preferably in the morning) to the shoulder and upper arms 10)  Singulair 10 Mg Tabs (Montelukast sodium) .Marland Kitchen.. 1 by mouth daily 11)  Crestor 10 Mg Tabs (Rosuvastatin calcium) .Marland Kitchen.. 1 tablet by mouth daily 12)  Gemfibrozil 600 Mg Tabs (Gemfibrozil) .Marland Kitchen.. 1 tab by mouth two times a day - 30 minutes prior to breakfast and dinner. 13)  Hydrocodone-acetaminophen 5-500 Mg Tabs (Hydrocodone-acetaminophen) .Marland Kitchen.. 1 - 2 by mouth q 6 hours  as needed pain 14)  Cyclobenzaprine Hcl 10 Mg Tabs (Cyclobenzaprine hcl) .Marland Kitchen.. 1 by mouth 3  times daily as needed for back pain 15)  Celebrex 200 Mg Caps (Celecoxib) .Marland Kitchen.. 1 by mouth daily (on plavix)  Other Orders: Venipuncture ([address]) TLB-BMP (Basic Metabolic Panel-BMET) (80048-METABOL)  Patient Instructions: 1)  Great to see you 2)  Please stop by to see Shirlee Limerick on your way out.   Orders Added: 1)  Venipuncture [36415] 2)  TLB-Lipid Panel [80061-LIPID] 3)  TLB-BMP (Basic Metabolic Panel-BMET) [80048-METABOL] 4)  TLB-Hepatic/Liver Function Pnl [80076-HEPATIC] 5)  TLB-TSH (Thyroid Stimulating Hormone) [84443-TSH] 6)  TLB-B12 + Folate Pnl [[id number]-B12/FOL] 7)  Radiology Referral [Radiology] 8)  Est.  Patient Level IV [60454]    Current Allergies (reviewed today): ! CHANTIX  Appended Document: OV TO DISCUSS HAVING LABS DONE/CLE  [facility]

## 2011-01-17 NOTE — Assessment & Plan Note (Signed)
Summary: Follow up heartburn   History of Present Illness Visit Type: Follow-up Visit Primary GI MD: Elie Goody MD [facility] Primary Provider: Ruthe Mannan MD Requesting Provider: n/a Chief Complaint: Heartburn and reflux, Here to discuss having an Upper Endoscopy History of Present Illness:   Bobby Pena relates a long history of reflux symptoms for over 20 years. He states his reflux symptoms were much worse when he chewed tobacco. He discontinued this and his reflux symptoms improved. Now he has reflux symptoms about twice per week and takes Rolaids with fairly good relief of his symptoms.   GI Review of Systems    Reports acid reflux, chest pain, and  heartburn.      Denies abdominal pain, belching, bloating, dysphagia with liquids, dysphagia with solids, loss of appetite, nausea, vomiting, vomiting blood, weight loss, and  weight gain.        Denies anal fissure, black tarry stools, change in bowel habit, constipation, diarrhea, diverticulosis, fecal incontinence, heme positive stool, hemorrhoids, irritable bowel syndrome, jaundice, light color stool, liver problems, rectal bleeding, and  rectal pain. Preventive Screening-Counseling & Management      Drug Use:  yes.     Current Medications (verified): 1)  Amlodipine Besylate 5 Mg Tabs (Amlodipine Besylate) .... Take One Tablet By Mouth Daily 2)  Plavix 75 Mg Tabs (Clopidogrel Bisulfate) .... Take One Tablet By Mouth Daily 3)  Ranexa 500 Mg Xr12h-Tab (Ranolazine) .Marland Kitchen.. 1 By Mouth Two Times A Day 4)  Lexapro 20 Mg Tabs (Escitalopram Oxalate) .... Take One Tablet By Mouth Daily 5)  Aspirin 325 Mg Tabs (Aspirin) .... Take One Tablet By Mouth Daily 6)  Fexofenadine Hcl 180 Mg Tabs (Fexofenadine Hcl) .... Take One Tablet By Mouth Daily 7)  Metoprolol Tartrate 25 Mg Tabs (Metoprolol Tartrate) .... Take One Tablet By Mouth Once Daily 8)  Zyban 150 Mg Xr12h-Tab (Bupropion Hcl (Smoking Deter)) .Marland Kitchen.. 1 Tab Po Daily X 3 Days; Increase To 1 Tab  Po Twice Daily; Treatment Should Continue For 7-12 Weeks. 9)  Androgel Pump 1 % Gel (Testosterone) .... 5 G Applied Once Daily (Preferably in The Morning) To The Shoulder and Upper Arms 10)  Singulair 10 Mg Tabs (Montelukast Sodium) .Marland Kitchen.. 1 By Mouth Daily 11)  Crestor 10 Mg Tabs (Rosuvastatin Calcium) .Marland Kitchen.. 1 Tablet By Mouth Daily 12)  Gemfibrozil 600 Mg Tabs (Gemfibrozil) .Marland Kitchen.. 1 Tab By Mouth Two Times A Day - 30 Minutes Prior To Breakfast and Dinner. 13)  Hydrocodone-Acetaminophen 5-500 Mg Tabs (Hydrocodone-Acetaminophen) .Marland Kitchen.. 1 - 2 By Mouth Q 6 Hours As Needed Pain 14)  Cyclobenzaprine Hcl 10 Mg  Tabs (Cyclobenzaprine Hcl) .Marland Kitchen.. 1 By Mouth 3  Times Daily As Needed For Back Pain 15)  Celebrex 200 Mg Caps (Celecoxib) .Marland Kitchen.. 1 By Mouth Daily (On Plavix)  Allergies (verified): 1)  ! 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  started at age 41. Alcohol use-yes 10 drinks/week Drug use-no Regular exercise-yes pt is single. pt has  children. Illicit Drug Use - yes  marijuanna occasional Drug Use:  yes  Vital Signs:  Patient profile:   55 year old male Height:      77 inches Weight:      295 pounds BMI:     35.11 BSA:     2.64 Pulse rate:   80 / minute Pulse rhythm:   regular BP sitting:   126 / 84  (left arm)  Vitals Entered By: Merri Ray CMA Duncan Dull) (January 11, 2011 11:32 AM)  Physical  Exam  General:  Well developed, well nourished, no acute distress. obese.   Head:  Normocephalic and atraumatic. Eyes:  PERRLA, no icterus. Mouth:  No deformity or lesions, dentition normal. Lungs:  Clear throughout to auscultation. Heart:  Regular rate and rhythm; no murmurs, rubs,  or bruits. Abdomen:  Soft, nontender and nondistended. No masses, hepatosplenomegaly or hernias noted. Normal bowel sounds. Extremities:  No clubbing, cyanosis, edema or deformities noted. Neurologic:  Alert and  oriented x4;  grossly normal neurologically. Psych:  Alert and cooperative. Normal mood and affect.  Impression & Recommendations:  Problem # 1:  GERD (ICD-530.81) Chronic GERD, for over 20 years. Recently symptoms have come under improve control after discontinuing chewing tobacco. Begin standard antireflux measures. Rule out Barrett's esophagus and erosive esophagitis. The risks, benefits and alternatives to endoscopy with possible biopsy and possible dilation were discussed with the patient and they consent to proceed. The procedure will be scheduled electively. Orders: EGD (EGD)  Problem # 2:  COLONIC POLYPS, HX OF (ICD-V12.72) Adenomatous colon polyps. Surveillance colonoscopy recommended December 2014.  Patient Instructions: 1)  Avoid foods high in acid content ( tomatoes, citrus juices, spicy foods) . Avoid eating within 3 to 4 hours of lying down or before exercising. Do not over eat; try smaller more frequent meals. Elevate head of bed four inches when sleeping.  2)  Upper Endoscopy brochure given.  3)  Copy sent to : Ruthe Mannan MD 4)  The medication list was reviewed and reconciled.  All changed / newly prescribed medications were explained.  A complete medication list was provided to the patient / caregiver.

## 2011-01-17 NOTE — Letter (Signed)
Summary: EGD Instructions  [facility]  [address] [place], [place] [address]   Phone: [phone]  Fax: [phone]       Bobby Pena    Jul 11, 1956    MRN: [id number]       Procedure Day Dorna Bloom: Wednesday February 29th, 2012     Arrival Time:  1:30pm     Procedure Time: 2:30pm     Location of Procedure:                    _ x _ [facility] (4th Floor)    PREPARATION FOR ENDOSCOPY   On 01/24/11 THE DAY OF THE PROCEDURE:  1.   No solid foods, milk or milk products are allowed after midnight the night before your procedure.  2.   Do not drink anything colored red or purple.  Avoid juices with pulp.  No orange juice.  3.  You may drink clear liquids until12:30pm, which is 2 hours before your procedure.                                                                                                CLEAR LIQUIDS INCLUDE: Water Jello Ice Popsicles Tea (sugar ok, no milk/cream) Powdered fruit flavored drinks Coffee (sugar ok, no milk/cream) Gatorade Juice: apple, white grape, white cranberry  Lemonade Clear bullion, consomm, broth Carbonated beverages (any kind) Strained chicken noodle soup Hard Candy   MEDICATION INSTRUCTIONS  Unless otherwise instructed, you should take regular prescription medications with a small sip of water as early as possible the morning of your procedure.  STAY ON PLAVIX!!             OTHER INSTRUCTIONS  You will need a responsible adult at least 54 years of age to accompany you and drive you home.   This person must remain in the waiting room during your procedure.  Wear loose fitting clothing that is easily removed.  Leave jewelry and other valuables at home.  However, you may wish to bring a book to read or an iPod/MP3 player to listen to music as you wait for your procedure to start.  Remove all body piercing jewelry and leave at home.  Total time from sign-in until discharge is approximately 2-3  hours.  You should go home directly after your procedure and rest.  You can resume normal activities the day after your procedure.  The day of your procedure you should not:   Drive   Make legal decisions   Operate machinery   Drink alcohol   Return to work  You will receive specific instructions about eating, activities and medications before you leave.    The above instructions have been reviewed and explained to me by   _______________________    I fully understand and can verbalize these instructions _____________________________ Date _________

## 2011-01-17 NOTE — Progress Notes (Signed)
----   Converted from flag ---- ---- 01/08/2011 10:45 AM, Ruthe Mannan MD wrote: Please add CBC and testosterone to his labs (257.2) ------------------------------  Appended Document:  Sorry cannot add cbcd, that tube was not drawn, but testosterone was added.

## 2011-01-18 ENCOUNTER — Encounter (HOSPITAL_COMMUNITY): Payer: Self-pay

## 2011-01-19 ENCOUNTER — Encounter (HOSPITAL_COMMUNITY): Payer: Self-pay

## 2011-01-23 ENCOUNTER — Encounter (HOSPITAL_COMMUNITY): Payer: Self-pay

## 2011-01-23 NOTE — Progress Notes (Signed)
Summary: wants opinion of lung ct  Phone Note Call from Patient   Caller: friend/candice gilliam Call For: Clance Summary of Call: Bobby Pena phoned stated that Bobby Pena had a CT of his lungs today and it shows COPD and Emphzima they would like for Dr Shelle Iron to look at the study and get his opinion. they can be reached at [phone] Initial call taken by: Vedia Coffer,  January 12, 2011 11:04 AM  Follow-up for Phone Call        called and spoke with candice and she stated that pt had a ct chest done today over at cardiology and they are requesting that [facility] look at the results and give his opinion on the study.  please advise. thanks [name] CMA  January 12, 2011 3:17 PM   Additional Follow-up for Phone Call Additional follow up Details #1::        let them know that I reviewed ct and does show some changes of emphysema.  That doesn't mean that it is affecting him, but does mean that he needs to quit smoking.  the only way to truly diagnosis emphysema is by breathing studies.  If he isn't having breathing issues, just needs to quit smoking.  If he is having breathing issues, needs to have breathing studies. Additional Follow-up by: Barbaraann Share MD,  January 12, 2011 5:57 PM    Additional Follow-up for Phone Call Additional follow up Details #2::    [name] advised of recs. She staets she will let pt know and decide is appt is needed. Carron Curie CMA  January 15, 2011 10:40 AM

## 2011-01-23 NOTE — Miscellaneous (Signed)
Summary: optimal pressure 18/14  Clinical Lists Changes  Orders: Added new Referral order of DME Referral (DME) - Signed optimal pressure appears to be 18/14, great compliance

## 2011-01-24 ENCOUNTER — Encounter: Payer: Self-pay | Admitting: Physician Assistant

## 2011-01-24 ENCOUNTER — Other Ambulatory Visit: Payer: Self-pay | Admitting: Gastroenterology

## 2011-01-24 ENCOUNTER — Other Ambulatory Visit: Payer: 59

## 2011-01-24 ENCOUNTER — Other Ambulatory Visit: Payer: 59 | Admitting: Gastroenterology

## 2011-01-24 ENCOUNTER — Other Ambulatory Visit: Payer: Self-pay | Admitting: Physician Assistant

## 2011-01-24 ENCOUNTER — Encounter: Payer: Self-pay | Admitting: Gastroenterology

## 2011-01-24 ENCOUNTER — Encounter (INDEPENDENT_AMBULATORY_CARE_PROVIDER_SITE_OTHER): Payer: 59 | Admitting: Physician Assistant

## 2011-01-24 DIAGNOSIS — E785 Hyperlipidemia, unspecified: Secondary | ICD-10-CM

## 2011-01-24 DIAGNOSIS — K2940 Chronic atrophic gastritis without bleeding: Secondary | ICD-10-CM

## 2011-01-24 DIAGNOSIS — F17200 Nicotine dependence, unspecified, uncomplicated: Secondary | ICD-10-CM

## 2011-01-24 DIAGNOSIS — I2510 Atherosclerotic heart disease of native coronary artery without angina pectoris: Secondary | ICD-10-CM

## 2011-01-24 LAB — HEPATIC FUNCTION PANEL
ALT: 42 U/L (ref 0–53)
Albumin: 4.4 g/dL (ref 3.5–5.2)
Alkaline Phosphatase: 62 U/L (ref 39–117)
Bilirubin, Direct: 0.2 mg/dL (ref 0.0–0.3)
Total Bilirubin: 0.8 mg/dL (ref 0.3–1.2)

## 2011-01-24 LAB — LIPID PANEL: HDL: 41.5 mg/dL (ref >39.00)

## 2011-01-24 LAB — LDL CHOLESTEROL, DIRECT: Direct LDL: 197.8 mg/dL

## 2011-01-24 NOTE — Assessment & Plan Note (Addendum)
Doing well.  Stress Myoview in September 2011 was negative for ischemia.  His EF was normal.  He's not having angina.  He will continue on aspirin and Plavix.  He will continue with cardiac rehabilitation.  Followup with Dr. Eden Emms in 6 months.

## 2011-01-24 NOTE — Assessment & Plan Note (Addendum)
I have applauded him for his lifestyle modifications.  I think this will have a large impact in helping his triglycerides.  I believe that we should switch him from gemfibrozil to fenofibrate.  Fenofibrates do  not cause elevated statin levels and therefore would be less likely to cause problems.  I will place him on TriCor 145 mg a day.  He is already on fish oil.  He will continue this.  We will continue his current statin dose.  He will have followup lipids and LFTs in 3-4 weeks.  If he is not making progress, I will refer him to the lipid clinic.  I discussed this with him today.

## 2011-01-24 NOTE — Assessment & Plan Note (Signed)
He is making a significant effort to quitting.  I have applauded him for this.

## 2011-01-24 NOTE — Progress Notes (Signed)
History of Present Illness: Primary Cardiologist:  Dr. Charlton Haws  The patient is a 55 year old male with CAD, status post stent to the obtuse marginal, hypertension and hyperlipidemia.  He's had significant problems with elevated triglycerides.  His primary care provider recently asked for him to follow up with [place] to manage his lipids.  On  01/08/11, his total cholesterol was 281, triglycerides 467, HDL 43 and LDL 185.  At that time he was placed back on Lopid.  He has made significant changes in his diet and exercise.  He has reduced his smoking from 3 packs to one pack per day.  He has completely discontinued alcohol use.  He was drinking on a daily basis previously.  He's been working out with cardiac rehabilitation.  He has lost 12 pounds in the last 3-4 weeks.  He states he feels great.  He denies chest pain, shortness of breath, syncope.  He denies orthopnea, PND or pedal.  He denies any myalgias.  He denies any history of pancreatitis.  He does have a familial history of hypertriglyceridemia.  Past Medical History  Diagnosis Date  . CAD (coronary artery disease)      a. h/o stent to the OM;  b. cath in 2004: OM stent patent, no obstructive CAD, EF 60%;    c.  h/o cath in [place];       d.  myoview 07/2010: EF 55%; inf. scar vs. artifact, no ischemia; low risk study  . Hypertension   . Hyperlipidemia   . COPD (chronic obstructive pulmonary disease)   . GERD (gastroesophageal reflux disease)   . Obstructive sleep apnea   . Depression   . Colon polyps   . Degenerative disc disease, lumbar   . Degenerative joint disease     Left knee    Current Medications: Current outpatient prescriptions:amLODipine (NORVASC) 5 MG tablet, Take 5 mg by mouth daily.  , Disp: , Rfl: ;  aspirin 325 MG tablet, Take 325 mg by mouth daily.  , Disp: , Rfl: ;  buPROPion (ZYBAN) 150 MG 12 hr tablet, Take 150 mg by mouth 2 (two) times daily.  , Disp: , Rfl: ;  celecoxib (CELEBREX) 200 MG capsule, Take 200 mg  by mouth daily.  , Disp: , Rfl: ;  clopidogrel (PLAVIX) 75 MG tablet, Take 75 mg by mouth daily.  , Disp: , Rfl:  cyclobenzaprine (FLEXERIL) 10 MG tablet, Take 10 mg by mouth 3 (three) times daily as needed.  , Disp: , Rfl: ;  escitalopram (LEXAPRO) 20 MG tablet, Take 20 mg by mouth daily.  , Disp: , Rfl: ;  fexofenadine (ALLEGRA) 180 MG tablet, Take 180 mg by mouth daily.  , Disp: , Rfl: ;  gemfibrozil (LOPID) 600 MG tablet, Take 600 mg by mouth 2 (two) times daily before a meal.  , Disp: , Rfl:  HYDROcodone-acetaminophen (VICODIN) 5-500 MG per tablet, Take 1-2 tablets by mouth every 6 (six) hours as needed.  , Disp: , Rfl: ;  metoprolol tartrate (LOPRESSOR) 25 MG tablet, Take 25 mg by mouth daily.  , Disp: , Rfl: ;  montelukast (SINGULAIR) 10 MG tablet, Take 10 mg by mouth at bedtime.  , Disp: , Rfl: ;  ranolazine (RANEXA) 500 MG 12 hr tablet, Take 500 mg by mouth 2 (two) times daily.  , Disp: , Rfl:  rosuvastatin (CRESTOR) 10 MG tablet, Take 10 mg by mouth daily.  , Disp: , Rfl: ;  Testosterone (ANDROGEL PUMP) 1.25 GM/ACT (1%) GEL,  Place 5 g onto the skin daily.  , Disp: , Rfl:   Allergies  Allergen Reactions  . Varenicline Tartrate     Vital Signs: BP 122/81  Pulse 66  Ht 6\' 5"  (1.956 m)  Wt 297 lb 4 oz (134.832 kg)  BMI 35.25 kg/m2  PHYSICAL EXAM: Well nourished, well developed, in no acute distress HEENT: normal Neck: no JVD Cardiac:  normal S1, S2; RRR; no murmur Lungs:  clear to auscultation bilaterally, no wheezing, rhonchi or rales Abd: soft, nontender, no hepatomegaly Ext: no edema Skin: warm and dry Neuro:  CNs 2-12 intact, no focal abnormalities noted  EKG: Normal sinus rhythm, heart rate 61, normal axis, interventricular conduction delay, no acute changes.

## 2011-01-25 ENCOUNTER — Encounter (HOSPITAL_COMMUNITY): Payer: 59

## 2011-01-25 ENCOUNTER — Encounter: Payer: Self-pay | Admitting: Physician Assistant

## 2011-01-25 ENCOUNTER — Encounter (INDEPENDENT_AMBULATORY_CARE_PROVIDER_SITE_OTHER): Payer: Self-pay | Admitting: *Deleted

## 2011-01-26 ENCOUNTER — Encounter (HOSPITAL_COMMUNITY): Payer: 59

## 2011-01-30 ENCOUNTER — Encounter (HOSPITAL_COMMUNITY): Payer: 59

## 2011-01-30 ENCOUNTER — Encounter: Payer: Self-pay | Admitting: Gastroenterology

## 2011-02-01 ENCOUNTER — Encounter (HOSPITAL_COMMUNITY): Payer: 59

## 2011-02-01 NOTE — Assessment & Plan Note (Signed)
Summary: Please see noted done in [facility] that is scanned in.   Visit Type:  Follow-up Referring Provider:  n/a Primary Provider:  Ruthe Mannan MD  CC:  no complaints.  History of Present Illness: Please see noted done in [facility] that is scanned in.  Current Medications (verified): 1)  Amlodipine Besylate 5 Mg Tabs (Amlodipine Besylate) .... Take One Tablet By Mouth Daily 2)  Plavix 75 Mg Tabs (Clopidogrel Bisulfate) .... Take One Tablet By Mouth Daily 3)  Ranexa 500 Mg Xr12h-Tab (Ranolazine) .Marland Kitchen.. 1 By Mouth Two Times A Day 4)  Lexapro 20 Mg Tabs (Escitalopram Oxalate) .... Take One Tablet By Mouth Daily 5)  Aspirin 325 Mg Tabs (Aspirin) .... Take One Tablet By Mouth Daily 6)  Fexofenadine Hcl 180 Mg Tabs (Fexofenadine Hcl) .... Take One Tablet By Mouth Daily 7)  Metoprolol Tartrate 25 Mg Tabs (Metoprolol Tartrate) .... Take One Tablet By Mouth Once Daily 8)  Zyban 150 Mg Xr12h-Tab (Bupropion Hcl (Smoking Deter)) .Marland Kitchen.. 1 Tab Po Daily X 3 Days; Increase To 1 Tab Po Twice Daily; Treatment Should Continue For 7-12 Weeks. 9)  Androgel Pump 1 % Gel (Testosterone) .... 5 G Applied Once Daily (Preferably in The Morning) To The Shoulder and Upper Arms 10)  Singulair 10 Mg Tabs (Montelukast Sodium) .Marland Kitchen.. 1 By Mouth Daily 11)  Crestor 10 Mg Tabs (Rosuvastatin Calcium) .Marland Kitchen.. 1 Tablet By Mouth Daily 12)  Gemfibrozil 600 Mg Tabs (Gemfibrozil) .Marland Kitchen.. 1 Tab By Mouth Two Times A Day - 30 Minutes Prior To Breakfast and Dinner. 13)  Hydrocodone-Acetaminophen 5-500 Mg Tabs (Hydrocodone-Acetaminophen) .Marland Kitchen.. 1 - 2 By Mouth Q 6 Hours As Needed Pain 14)  Cyclobenzaprine Hcl 10 Mg  Tabs (Cyclobenzaprine Hcl) .Marland Kitchen.. 1 By Mouth 3  Times Daily As Needed For Back Pain 15)  Celebrex 200 Mg Caps (Celecoxib) .Marland Kitchen.. 1 By Mouth Daily (On Plavix)  Allergies (verified): 1)  ! Chantix  Vital Signs:  Patient profile:   55 year old male Height:      77 inches Weight:      297.25 pounds BMI:     35.38 Pulse  rate:   66 / minute BP sitting:   122 / 81  (left arm) Cuff size:   large  Vitals Entered By: Caralee Ates CMA (January 24, 2011 9:20 AM)   Other Orders: EKG w/ Interpretation (93000) TLB-Lipid Panel (80061-LIPID) TLB-Hepatic/Liver Function Pnl (80076-HEPATIC)  Patient Instructions: 1)  Your physician recommends that you return for a FASTING lipid /liver profile 272.4 today as per Tereso Newcomer, PA-C.  ....ALSO WE NEED TO REPEAT LIVER AND LIPID FUNCTION 272.4 02/19/11 ANY TIME BETWEEN 8:30 AM AND 4:15 PM. 2)  Your physician wants you to follow-up in:  6 months with Dr. Eden Emms as per Tereso Newcomer, PA-C. You will receive a reminder letter in the mail two months in advance. If you don't receive a letter, please call our office to schedule the follow-up appointment. 3)  Your physician has recommended you make the following change in your medication: STOP TAKING GEMFIBROZIL AND START TAKING TRICOR 145 MG 1 TABLET DAILY. CONTINUE ON CRESTOR AND FISH OIL Prescriptions: TRICOR 145 MG TABS (FENOFIBRATE) 1 tab once daily  #30 x 11   Entered by:   Danielle Rankin, CMA   Authorized by:   Tereso Newcomer PA-C   Signed by:   Danielle Rankin, CMA on 01/24/2011   Method used:   Electronically to        [facility]  #  [address] (retail)       [address]       [place], [place]  [address]       Ph: [phone]       Fax: [phone]   RxID:   [id number]  I have personally reviewed the prescriptions today for accuracy. Tereso Newcomer PA-C  January 24, 2011 1:42 PM

## 2011-02-01 NOTE — Procedures (Addendum)
Summary: Upper Endoscopy  Patient: Bobby Pena Note: All result statuses are Final unless otherwise noted.  Tests: (1) Upper Endoscopy (EGD)   EGD Upper Endoscopy       DONE     [facility]     [address]     [place], [place]  [address]          ENDOSCOPY PROCEDURE REPORT     PATIENT:  Bobby Pena, Bobby Pena  MR#:  [id number]     BIRTHDATE:  July 04, 1956, 54 yrs. old  GENDER:  male     ENDOSCOPIST:  Judie Petit T. Russella Dar, MD, [facility]          PROCEDURE DATE:  01/24/2011     PROCEDURE:  EGD with biopsy, [address]     ASA CLASS:  Class II     INDICATIONS:  GERD     MEDICATIONS:  Fentanyl 100 mcg IV, Versed 10 mg IV     TOPICAL ANESTHETIC:  Exactacain Spray     DESCRIPTION OF PROCEDURE:   After the risks benefits and     alternatives of the procedure were thoroughly explained, informed     consent was obtained.  The LB GIF-H180 [id number] endoscope was     introduced through the mouth and advanced to the second portion of     the duodenum, without limitations.  The instrument was slowly     withdrawn as the mucosa was fully examined.     <<PROCEDUREIMAGES>>     The esophagus and gastroesophageal junction were completely normal     in appearance. Moderate gastritis was found in the antrum. It was     erosive and erythematous. Biopsies of the antrum and body of the     stomach were obtained and sent to pathology.  Mild gastritis was     found in the remainder of the stomach. Duodenitis was found in the     bulb and descending duodenum. It was erythematous and edematous.     Otherwise the examination was normal.    Retroflexed views     revealed no abnormalities.  The scope was then withdrawn from the     patient and the procedure completed.          COMPLICATIONS:  None          ENDOSCOPIC IMPRESSION:          1) Gastritis     2) Duodenitis          RECOMMENDATIONS:     1) Anti-reflux regimen     2) Await pathology results     3) PPI qam: omeprazole 20 mg po qam, #30, 11  refills          Dylan Monforte T. Russella Dar, MD, [facility]          CC:  Ruthe Mannan MD          n.     Rosalie DoctorVenita Lick. Didier Brandenburg at 01/24/2011 03:36 PM          Laverle Hobby, [id number]  Note: An exclamation mark (!) indicates a result that was not dispersed into the flowsheet. Document Creation Date: 01/24/2011 3:36 PM _______________________________________________________________________  (1) Order result status: Final Collection or observation date-time: 01/24/2011 15:24 Requested date-time:  Receipt date-time:  Reported date-time:  Referring Physician:   Ordering Physician: Claudette Head [phone]) Specimen Source:  Source: Launa Grill Order Number: [id number] Lab site:

## 2011-02-01 NOTE — Miscellaneous (Signed)
Summary: change in crestor dose..rx sent in today..pt aware  Clinical Lists Changes  Medications: Changed medication from CRESTOR 10 MG TABS (ROSUVASTATIN CALCIUM) 1 tablet by mouth daily to CRESTOR 40 MG TABS (ROSUVASTATIN CALCIUM) 1 tab at bedtime - Signed Rx of CRESTOR 40 MG TABS (ROSUVASTATIN CALCIUM) 1 tab at bedtime;  #90 x 4;  Signed;  Entered by: Danielle Rankin, CMA;  Authorized by: Tereso Newcomer PA-C;  Method used: Electronically to [facility]  [address], [place], [place], [place]  [address], Ph: [phone], Fax: [phone]    Prescriptions: CRESTOR 40 MG TABS (ROSUVASTATIN CALCIUM) 1 tab at bedtime  #90 x 4   Entered by:   Danielle Rankin, CMA   Authorized by:   Tereso Newcomer PA-C   Signed by:   Danielle Rankin, CMA on 01/25/2011   Method used:   Electronically to        [facility]  [address] (retail)       [address]       [place], [place]  [address]       Ph: [phone]       Fax: [phone]   RxID:   [id number]   Phone Note Outgoing Call Call back at Home Phone [phone] [facility]     Summary of Call: pt aware of lab results and medication change...sent in new rx for crestor 40 mg to [facility] [address] in [place] today...Marland KitchenMarland KitchenDanielle Rankin, CMA  January 25, 2011 4:40 PM     New/Updated Medications: CRESTOR 40 MG TABS (ROSUVASTATIN CALCIUM) 1 tab at bedtime

## 2011-02-01 NOTE — Letter (Signed)
Summary: Lipid Letter  [facility], Main Office  [address]   [place], [place] [address]   Phone: [phone]  Fax: [phone]    01/25/2011  Bobby Pena [address] [place], [place]  [address]  Dear [name]:  We have carefully reviewed your last lipid profile from  and the results are noted below with a summary of recommendations for lipid management. We would like for you to continue the great effort you have been making to change your lifestyle to a more healthy lifestyle. The numbers are showing improvement, keep up the good work!!!!    Cholesterol:       269     Goal: < 0-200   HDL "good" Cholesterol:   84.13     Goal: > 39   LDL "bad" Cholesterol:   197.8     Goal: < 70 any one with Coronary Artery Disease   Triglycerides:       296.0     Goal: < 0.0-149.0        TLC Diet (Therapeutic Lifestyle Change): Saturated Fats & Transfatty acids should be kept < 7% of total calories ***Reduce Saturated Fats Polyunstaurated Fat can be up to 10% of total calories Monounsaturated Fat Fat can be up to 20% of total calories Total Fat should be no greater than 25-35% of total calories Carbohydrates should be 50-60% of total calories Protein should be approximately 15% of total calories Fiber should be at least 20-30 grams a day ***Increased fiber may help lower LDL Total Cholesterol should be < 200mg /day Consider adding plant stanol/sterols to diet (example: Benacol spread) ***A higher intake of unsaturated fat may reduce Triglycerides and Increase HDL    Adjunctive Measures (may lower LIPIDS and reduce risk of Heart Attack) include: Aerobic Exercise (20-30 minutes 3-4 times a week) Limit Alcohol Consumption Weight Reduction Aspirin 75-81 mg a day by mouth (if not allergic or contraindicated) Dietary Fiber 20-30 grams a day by mouth     Current Medications: 1)    Amlodipine Besylate 5 Mg Tabs (Amlodipine besylate) .... Take one tablet by mouth daily 2)     Plavix 75 Mg Tabs (Clopidogrel bisulfate) .... Take one tablet by mouth daily 3)    Ranexa 500 Mg Xr12h-tab (Ranolazine) .Marland Kitchen.. 1 by mouth two times a day 4)    Lexapro 20 Mg Tabs (Escitalopram oxalate) .... Take one tablet by mouth daily 5)    Aspirin 325 Mg Tabs (Aspirin) .... Take one tablet by mouth daily 6)    Fexofenadine Hcl 180 Mg Tabs (Fexofenadine hcl) .... Take one tablet by mouth daily 7)    Metoprolol Tartrate 25 Mg Tabs (Metoprolol tartrate) .... Take one tablet by mouth once daily 8)    Zyban 150 Mg Xr12h-tab (Bupropion hcl (smoking deter)) .Marland Kitchen.. 1 tab po daily x 3 days; increase to 1 tab po twice daily; treatment should continue for 7-12 weeks. 9)    Androgel Pump 1 % Gel (Testosterone) .... 5 g applied once daily (preferably in the morning) to the shoulder and upper arms 10)    Singulair 10 Mg Tabs (Montelukast sodium) .Marland Kitchen.. 1 by mouth daily 11)    Crestor 40 Mg Tabs (Rosuvastatin calcium) .Marland Kitchen.. 1 tab at bedtime 12)    Hydrocodone-acetaminophen 5-500 Mg Tabs (Hydrocodone-acetaminophen) .Marland Kitchen.. 1 - 2 by mouth q 6 hours as needed pain 13)    Cyclobenzaprine Hcl 10 Mg  Tabs (Cyclobenzaprine hcl) .Marland Kitchen.. 1 by mouth 3  times daily as needed for  back pain 14)    Celebrex 200 Mg Caps (Celecoxib) .Marland Kitchen.. 1 by mouth daily (on plavix) 15)    Tricor 145 Mg Tabs (Fenofibrate) .Marland Kitchen.. 1 tab once daily 16)    Omeprazole 20 Mg  Cpdr (Omeprazole) .Marland Kitchen.. 1 each day 30 minutes before meal  If you have any questions, please call. We appreciate being able to work with you.   Sincerely,    [facility], Main Office [place], [place]

## 2011-02-01 NOTE — Miscellaneous (Signed)
Summary: omeprazole rx.  Clinical Lists Changes  Medications: Added new medication of OMEPRAZOLE 20 MG  CPDR (OMEPRAZOLE) 1 each day 30 minutes before meal - Signed Rx of OMEPRAZOLE 20 MG  CPDR (OMEPRAZOLE) 1 each day 30 minutes before meal;  #30 x 11;  Signed;  Entered by: Darlyn Read RN;  Authorized by: Meryl Dare MD [facility];  Method used: Electronically to [facility]  [address], [address], [place], [place], [place]  [address], Ph: [phone], Fax: [phone]    Prescriptions: OMEPRAZOLE 20 MG  CPDR (OMEPRAZOLE) 1 each day 30 minutes before meal  #30 x 11   Entered by:   Darlyn Read RN   Authorized by:   Meryl Dare MD [facility]   Signed by:   Darlyn Read RN on 01/24/2011   Method used:   Electronically to        [facility]  [address] (retail)       [address], [address]       [place], [place]  [address]       Ph: [phone]       Fax: [phone]   RxID:   [id number]

## 2011-02-02 ENCOUNTER — Encounter (HOSPITAL_COMMUNITY): Payer: 59 | Attending: Cardiovascular Disease

## 2011-02-02 DIAGNOSIS — I2510 Atherosclerotic heart disease of native coronary artery without angina pectoris: Secondary | ICD-10-CM | POA: Insufficient documentation

## 2011-02-02 DIAGNOSIS — I10 Essential (primary) hypertension: Secondary | ICD-10-CM | POA: Insufficient documentation

## 2011-02-02 DIAGNOSIS — G4733 Obstructive sleep apnea (adult) (pediatric): Secondary | ICD-10-CM | POA: Insufficient documentation

## 2011-02-02 DIAGNOSIS — I252 Old myocardial infarction: Secondary | ICD-10-CM | POA: Insufficient documentation

## 2011-02-02 DIAGNOSIS — K219 Gastro-esophageal reflux disease without esophagitis: Secondary | ICD-10-CM | POA: Insufficient documentation

## 2011-02-02 DIAGNOSIS — Z5189 Encounter for other specified aftercare: Secondary | ICD-10-CM | POA: Insufficient documentation

## 2011-02-02 DIAGNOSIS — E785 Hyperlipidemia, unspecified: Secondary | ICD-10-CM | POA: Insufficient documentation

## 2011-02-02 DIAGNOSIS — F3289 Other specified depressive episodes: Secondary | ICD-10-CM | POA: Insufficient documentation

## 2011-02-02 DIAGNOSIS — R0602 Shortness of breath: Secondary | ICD-10-CM | POA: Insufficient documentation

## 2011-02-02 DIAGNOSIS — F329 Major depressive disorder, single episode, unspecified: Secondary | ICD-10-CM | POA: Insufficient documentation

## 2011-02-06 ENCOUNTER — Encounter (HOSPITAL_COMMUNITY): Payer: 59

## 2011-02-06 NOTE — Letter (Signed)
Summary: [facility] Progress Note  [facility] Progress Note   Imported By: Earl Many 02/02/2011 12:49:48  _____________________________________________________________________  External Attachment:    Type:   Image     Comment:   External Document

## 2011-02-06 NOTE — Letter (Signed)
Summary: Patient [facility] Biopsy Results  [facility]  [address] [place], [place] [address]   Phone: [phone]  Fax: [phone]        January 30, 2011 MRN: [id number]    Bobby Pena [address] [place], [place]  [address]    Dear Mr. Mantell,  I am pleased to inform you that the biopsies taken during your recent endoscopic examination did not show any evidence of cancer upon pathologic examination. The biopsies showed mild chronic gastritis.  Continue with the treatment plan as outlined on the day of your      exam.  Please call us if you are having persistent problems or have questions about your condition that have not been fully answered at this time.  Sincerely,  Meryl Dare MD [facility]  This letter has been electronically signed by your physician.  Appended Document: Patient Notice-Endo Biopsy Results letter mailed

## 2011-02-07 ENCOUNTER — Telehealth: Payer: Self-pay | Admitting: Family Medicine

## 2011-02-08 ENCOUNTER — Encounter (HOSPITAL_COMMUNITY): Payer: 59

## 2011-02-09 ENCOUNTER — Encounter (HOSPITAL_COMMUNITY): Payer: 59

## 2011-02-13 ENCOUNTER — Encounter (HOSPITAL_COMMUNITY): Payer: 59

## 2011-02-13 NOTE — Progress Notes (Signed)
Summary: refill request for vicodin, celebrex  Phone Note Refill Request Message from:  Fax from Pharmacy  Refills Requested: Medication #1:  HYDROCODONE-ACETAMINOPHEN 5-500 MG TABS 1 - 2 by mouth q 6 hours as needed pain   Last Refilled: 12/21/2010  Medication #2:  CELEBREX 200 MG CAPS 1 by mouth daily (on plavix)   Last Refilled: 01/01/2011  Medication #3:  CYCLOBENZAPRINE HCL 10 MG  TABS 1 by mouth 3  times daily as needed for back pain Faxed request from [facility], [phone].  Initial call taken by: Lowella Petties CMA, AAMA,  February 07, 2011 9:52 AM  Follow-up for Phone Call        Rx's called to pharmacy. Follow-up by: Linde Gillis CMA Duncan Dull),  February 07, 2011 10:49 AM    Prescriptions: CYCLOBENZAPRINE HCL 10 MG  TABS (CYCLOBENZAPRINE HCL) 1 by mouth 3  times daily as needed for back pain  #50 x 0   Entered and Authorized by:   Ruthe Mannan MD   Signed by:   Ruthe Mannan MD on 02/07/2011   Method used:   Telephoned to ...       [facility] (retail)       [address]       [place], [place]  [address]       Ph: [phone]       Fax: [phone]   RxID:   [id number] CELEBREX 200 MG CAPS (CELECOXIB) 1 by mouth daily (on plavix)  #30 x 5   Entered and Authorized by:   Ruthe Mannan MD   Signed by:   Ruthe Mannan MD on 02/07/2011   Method used:   Telephoned to ...       [facility] [address] (retail)       [address], [address]       [place], [place]  [address]       Ph: [phone]       Fax: [phone]   RxID:   [id number] HYDROCODONE-ACETAMINOPHEN 5-500 MG TABS (HYDROCODONE-ACETAMINOPHEN) 1 - 2 by mouth q 6 hours as needed pain  #50 x 0   Entered and Authorized by:   Ruthe Mannan MD   Signed by:   Ruthe Mannan MD on 02/07/2011   Method used:   Telephoned to ...       [facility] [address] (retail)       [address], [address]       [place], [place]   [address]       Ph: [phone]       Fax: [phone]   RxID:   [id number]

## 2011-02-15 ENCOUNTER — Encounter (HOSPITAL_COMMUNITY): Payer: 59

## 2011-02-16 ENCOUNTER — Encounter (HOSPITAL_COMMUNITY): Payer: 59

## 2011-02-19 ENCOUNTER — Telehealth: Payer: Self-pay | Admitting: Cardiovascular Disease

## 2011-02-19 NOTE — Telephone Encounter (Signed)
Pt has question re blood owrk order for lipid

## 2011-02-19 NOTE — Telephone Encounter (Signed)
Spoke with pt, questions answered.

## 2011-02-20 ENCOUNTER — Encounter (HOSPITAL_COMMUNITY): Payer: 59

## 2011-02-22 ENCOUNTER — Encounter (HOSPITAL_COMMUNITY): Payer: 59

## 2011-02-23 ENCOUNTER — Encounter (HOSPITAL_COMMUNITY): Payer: 59

## 2011-02-26 ENCOUNTER — Encounter: Payer: Self-pay | Admitting: Family Medicine

## 2011-02-26 ENCOUNTER — Ambulatory Visit (INDEPENDENT_AMBULATORY_CARE_PROVIDER_SITE_OTHER): Payer: 59 | Admitting: Family Medicine

## 2011-02-26 DIAGNOSIS — M1710 Unilateral primary osteoarthritis, unspecified knee: Secondary | ICD-10-CM

## 2011-02-26 DIAGNOSIS — M25569 Pain in unspecified knee: Secondary | ICD-10-CM

## 2011-02-26 MED ORDER — HYDROCODONE-ACETAMINOPHEN 5-500 MG PO TABS: 1.0000 | ORAL_TABLET | Freq: Four times a day (QID) | ORAL | Status: DC | PRN

## 2011-02-26 NOTE — Progress Notes (Signed)
55 year old f/u L knee pain:  Pleasant gentleman with a history of left arthroscopy, knee, probable partial meniscectomy and probable loose body removal 3 years ago who presents with recurrent knee pain. I saw him in early February and injected his knee with an intra-articular injection of Kenalog. He had a good response for about 6-8 weeks but now he has had a recurrence of effusion and continued pain. He is taking half a tablet of Vicodin in the morning with good relief, and he is also taking Celebrex daily.  He continues to be up on his knee in a work situation.  Swelling up and going down.  300 pounds  The PMH, PSH, Social History, Family History, Medications, and allergies have been reviewed in [facility], and have been updated if relevant.  REVIEW OF SYSTEMS  GEN: No fevers, chills. Nontoxic. Primarily MSK c/o today. MSK: Detailed in the HPI GI: tolerating PO intake without difficulty Neuro: No numbness, parasthesias, or tingling associated. Otherwise the pertinent positives of the ROS are noted above.   GEN: Well-developed,well-nourished,in no acute distress; alert,appropriate and cooperative throughout examination HEENT: Normocephalic and atraumatic without obvious abnormalities. Ears, externally no deformities PULM: Breathing comfortably in no respiratory distress EXT: No clubbing, cyanosis, or edema PSYCH: Normally interactive. Cooperative during the interview. Pleasant. Friendly and conversant. Not anxious or depressed appearing. Normal, full affect.  Gait: Normal heel toe pattern ROM: WNL Effusion: mild Echymosis or edema: none Patellar tendon NT Painful PLICA: neg Patellar grind: negative Medial and lateral patellar facet loading: negative medial and lateral joint lines: mild pain Mcmurray's neg Flexion-pinch pos Varus and valgus stress: stable Lachman: neg Ant and Post drawer: neg Hip abduction, IR, ER: WNL Hip flexion str: 5/5 Hip abd: 5/5 Quad: 5/5 VMO  atrophy:No Hamstring concentric and eccentric: 5/5  A/P: Knee pain, cannot rule out left internal internal derangement, left osteoarthritis. >25 minutes spent in face to face time with patient, >50% spent in counselling or coordination of care  Continue his pain medications and anti-inflammatories as above.  We discussed the various treatment how rhythms. I think he certainly could have occult internal derangement, and we discussed that it would be very reasonable to consider an MRI to evaluate for abnormal pathology and it is also reasonable to consider operative interventions. At this point he would like to be more conservative, we'll continue with oral medications, and I placed him in a DonJoy playmaker brace to limit his rotational motion and limit his varus and valgus stress on his knee. Hopefully, he will get some symptomatic relief in the workplace.

## 2011-02-26 NOTE — Progress Notes (Signed)
Addended by: Hannah Beat on: 02/26/2011 02:36 PM   Modules accepted: Orders

## 2011-02-27 ENCOUNTER — Encounter (HOSPITAL_COMMUNITY): Payer: 59 | Attending: Cardiovascular Disease

## 2011-02-27 DIAGNOSIS — I2510 Atherosclerotic heart disease of native coronary artery without angina pectoris: Secondary | ICD-10-CM | POA: Insufficient documentation

## 2011-02-27 DIAGNOSIS — I252 Old myocardial infarction: Secondary | ICD-10-CM | POA: Insufficient documentation

## 2011-02-27 DIAGNOSIS — G4733 Obstructive sleep apnea (adult) (pediatric): Secondary | ICD-10-CM | POA: Insufficient documentation

## 2011-02-27 DIAGNOSIS — R0602 Shortness of breath: Secondary | ICD-10-CM | POA: Insufficient documentation

## 2011-02-27 DIAGNOSIS — F3289 Other specified depressive episodes: Secondary | ICD-10-CM | POA: Insufficient documentation

## 2011-02-27 DIAGNOSIS — Z5189 Encounter for other specified aftercare: Secondary | ICD-10-CM | POA: Insufficient documentation

## 2011-02-27 DIAGNOSIS — I10 Essential (primary) hypertension: Secondary | ICD-10-CM | POA: Insufficient documentation

## 2011-02-27 DIAGNOSIS — K219 Gastro-esophageal reflux disease without esophagitis: Secondary | ICD-10-CM | POA: Insufficient documentation

## 2011-02-27 DIAGNOSIS — E785 Hyperlipidemia, unspecified: Secondary | ICD-10-CM | POA: Insufficient documentation

## 2011-02-27 DIAGNOSIS — F329 Major depressive disorder, single episode, unspecified: Secondary | ICD-10-CM | POA: Insufficient documentation

## 2011-02-28 ENCOUNTER — Other Ambulatory Visit (INDEPENDENT_AMBULATORY_CARE_PROVIDER_SITE_OTHER): Payer: 59 | Admitting: *Deleted

## 2011-02-28 DIAGNOSIS — E7800 Pure hypercholesterolemia, unspecified: Secondary | ICD-10-CM

## 2011-02-28 LAB — HEPATIC FUNCTION PANEL
Alkaline Phosphatase: 53 U/L (ref 39–117)
Bilirubin, Direct: 0.2 mg/dL (ref 0.0–0.3)

## 2011-02-28 LAB — LIPID PANEL
Total CHOL/HDL Ratio: 4
VLDL: 47 mg/dL — ABNORMAL HIGH (ref 0.0–40.0)

## 2011-03-01 ENCOUNTER — Encounter (HOSPITAL_COMMUNITY): Payer: 59

## 2011-03-02 ENCOUNTER — Encounter (HOSPITAL_COMMUNITY): Payer: 59

## 2011-03-06 ENCOUNTER — Encounter (HOSPITAL_COMMUNITY): Payer: 59

## 2011-03-08 ENCOUNTER — Encounter (HOSPITAL_COMMUNITY): Payer: 59

## 2011-03-09 ENCOUNTER — Encounter (HOSPITAL_COMMUNITY): Payer: 59

## 2011-03-13 ENCOUNTER — Encounter (HOSPITAL_COMMUNITY): Payer: 59

## 2011-03-14 ENCOUNTER — Other Ambulatory Visit: Payer: Self-pay | Admitting: *Deleted

## 2011-03-14 MED ORDER — HYDROCODONE-ACETAMINOPHEN 5-500 MG PO TABS: 1.0000 | ORAL_TABLET | Freq: Four times a day (QID) | ORAL | Status: DC | PRN

## 2011-03-14 NOTE — Telephone Encounter (Signed)
[name] called to check the status of Rx refill, I advised her that Dr. Patsy Lager denied the refill and stated that the patient needs to wait for Dr. Elmer Sow return tomorrow.  I stated that with any controlled substance the authorization should come from the patient's pcp even if the patient has seen another provider.  She understood and I advised that I will talk with Dr. Dayton Martes and call them back tomorrow.

## 2011-03-14 NOTE — Telephone Encounter (Signed)
I have seen this patient before. Can discuss with my partner in AM, but i am not comfortable taking on pain management in this case, and is better served with PCP coordination.

## 2011-03-15 ENCOUNTER — Encounter (HOSPITAL_COMMUNITY): Payer: 59

## 2011-03-15 ENCOUNTER — Other Ambulatory Visit: Payer: Self-pay | Admitting: *Deleted

## 2011-03-15 ENCOUNTER — Telehealth: Payer: Self-pay | Admitting: *Deleted

## 2011-03-15 NOTE — Telephone Encounter (Signed)
Opened in error

## 2011-03-15 NOTE — Telephone Encounter (Signed)
Called Rx in to [facility], Candice notifed via telephone.

## 2011-03-16 ENCOUNTER — Encounter (HOSPITAL_COMMUNITY): Payer: 59

## 2011-03-16 ENCOUNTER — Encounter: Payer: Self-pay | Admitting: Family Medicine

## 2011-03-16 ENCOUNTER — Telehealth: Payer: Self-pay | Admitting: *Deleted

## 2011-03-16 NOTE — Telephone Encounter (Signed)
Pt is still out of town and in a lot of pain. He has been doing a lot of fishing and he has thrown his back out.  Vicodin 5/500 was called in for patient to take one every 4 hours, pt is asking if ok to take 2 at a time.

## 2011-03-16 NOTE — Telephone Encounter (Signed)
No I am not ok with him increasing his pain medication unfortunately.  Continue current dose.  Try to apply heat to his back.

## 2011-03-16 NOTE — Telephone Encounter (Signed)
Advised [name] as instructed via telephone.  She stated that patient is still in a lot of pain.  Advised her that he will need to be reevaluated if he wants Dr. Dayton Martes to increase his pain medication if she thinks its necessary.  Scheduled appt for 03/19/2011.

## 2011-03-19 ENCOUNTER — Encounter: Payer: Self-pay | Admitting: Family Medicine

## 2011-03-19 ENCOUNTER — Ambulatory Visit (INDEPENDENT_AMBULATORY_CARE_PROVIDER_SITE_OTHER): Payer: 59 | Admitting: Family Medicine

## 2011-03-19 DIAGNOSIS — M5137 Other intervertebral disc degeneration, lumbosacral region: Secondary | ICD-10-CM

## 2011-03-19 DIAGNOSIS — M25569 Pain in unspecified knee: Secondary | ICD-10-CM

## 2011-03-19 DIAGNOSIS — M549 Dorsalgia, unspecified: Secondary | ICD-10-CM

## 2011-03-19 DIAGNOSIS — M1710 Unilateral primary osteoarthritis, unspecified knee: Secondary | ICD-10-CM

## 2011-03-19 MED ORDER — OXYCODONE-ACETAMINOPHEN 7.5-500 MG PO TABS: 1.0000 | ORAL_TABLET | Freq: Four times a day (QID) | ORAL | Status: DC | PRN

## 2011-03-19 NOTE — Patient Instructions (Signed)
Please stop by to see Bobby Pena on your way out. Keep your appointment with Dr. Patsy Lager.

## 2011-03-19 NOTE — Progress Notes (Signed)
55 year old here to discuss chronic pain. Has been seeing Dr. Patsy Lager for left knee pain, wearing a brace which has not been helping much.  Has a history of left arthroscopy, knee, probable partial meniscectomy and probable loose body removal 3 years ago.  Also has h/o chronic low back pain, multiple disc issues in lumbar region, declined surgery in past.  Vicodin was helping, now taking "like candy" without relief.  Occasionally has lower extremity radiculopathy.  No urinary retention or saddle anesthesia.  The PMH, PSH, Social History, Family History, Medications, and allergies have been reviewed in [facility], and have been updated if relevant.  REVIEW OF SYSTEMS  GEN: No fevers, chills. Nontoxic. Primarily MSK c/o today. MSK: Detailed in the HPI GI: tolerating PO intake without difficulty Neuro: No numbness, parasthesias, or tingling associated. Otherwise the pertinent positives of the ROS are noted above.   PHYSICAL EXAM: BP 140/80  Pulse 65  Temp(Src) 98.4 F (36.9 C) (Oral)  Ht 6\' 5"  (1.956 m)  Wt 301 lb (136.533 kg)  BMI 35.69 kg/m2 GEN: Well-developed,well-nourished,in no acute distress; alert,appropriate and cooperative throughout examination HEENT: Normocephalic and atraumatic without obvious abnormalities. Ears, externally no deformities PULM: Breathing comfortably in no respiratory distress EXT: No clubbing, cyanosis, or edema PSYCH: Normally interactive. Cooperative during the interview. Pleasant. Friendly and conversant. Not anxious or depressed appearing. Normal, full affect. MSK:  Left knee in brace- did not examen today  SLR pos right, neg left.  Walks with limp. DTS equal.

## 2011-03-19 NOTE — Assessment & Plan Note (Signed)
Unchanged.  Advised follow up with Dr. Patsy Lager, will likely require MRI for further evaluation.

## 2011-03-19 NOTE — Assessment & Plan Note (Signed)
Intermittent in nature, seems to be exacerbated by knee issues he is having. Pt is not typically a drug seeker and no red flags on controlled substances database. >25 min spent with face to face with patient counseling and coordinating care. Will give rx for percocet, d/c vicodin and refer to pain clinic for longer term management. Discussed possibility of PMNR injections.  Pt is open to that option.

## 2011-03-20 ENCOUNTER — Encounter (HOSPITAL_COMMUNITY): Payer: 59

## 2011-03-22 ENCOUNTER — Encounter (HOSPITAL_COMMUNITY): Payer: 59

## 2011-03-23 ENCOUNTER — Encounter (HOSPITAL_COMMUNITY): Payer: 59

## 2011-03-27 ENCOUNTER — Encounter (HOSPITAL_COMMUNITY): Payer: 59 | Attending: Cardiovascular Disease

## 2011-03-27 DIAGNOSIS — I2510 Atherosclerotic heart disease of native coronary artery without angina pectoris: Secondary | ICD-10-CM | POA: Insufficient documentation

## 2011-03-27 DIAGNOSIS — F329 Major depressive disorder, single episode, unspecified: Secondary | ICD-10-CM | POA: Insufficient documentation

## 2011-03-27 DIAGNOSIS — R0602 Shortness of breath: Secondary | ICD-10-CM | POA: Insufficient documentation

## 2011-03-27 DIAGNOSIS — F3289 Other specified depressive episodes: Secondary | ICD-10-CM | POA: Insufficient documentation

## 2011-03-27 DIAGNOSIS — G4733 Obstructive sleep apnea (adult) (pediatric): Secondary | ICD-10-CM | POA: Insufficient documentation

## 2011-03-27 DIAGNOSIS — K219 Gastro-esophageal reflux disease without esophagitis: Secondary | ICD-10-CM | POA: Insufficient documentation

## 2011-03-27 DIAGNOSIS — I10 Essential (primary) hypertension: Secondary | ICD-10-CM | POA: Insufficient documentation

## 2011-03-27 DIAGNOSIS — I252 Old myocardial infarction: Secondary | ICD-10-CM | POA: Insufficient documentation

## 2011-03-27 DIAGNOSIS — Z5189 Encounter for other specified aftercare: Secondary | ICD-10-CM | POA: Insufficient documentation

## 2011-03-27 DIAGNOSIS — E785 Hyperlipidemia, unspecified: Secondary | ICD-10-CM | POA: Insufficient documentation

## 2011-03-29 ENCOUNTER — Encounter: Payer: Self-pay | Admitting: Pulmonary Disease

## 2011-03-29 ENCOUNTER — Encounter (HOSPITAL_COMMUNITY): Payer: 59

## 2011-03-30 ENCOUNTER — Encounter (HOSPITAL_COMMUNITY): Payer: 59

## 2011-04-02 ENCOUNTER — Ambulatory Visit: Payer: 59 | Admitting: Pulmonary Disease

## 2011-04-03 ENCOUNTER — Encounter (HOSPITAL_COMMUNITY): Payer: 59

## 2011-04-05 ENCOUNTER — Encounter (HOSPITAL_COMMUNITY): Payer: 59

## 2011-04-06 ENCOUNTER — Encounter (HOSPITAL_COMMUNITY): Payer: 59

## 2011-04-10 ENCOUNTER — Encounter (HOSPITAL_COMMUNITY): Payer: 59

## 2011-04-10 NOTE — Op Note (Signed)
Bobby Pena, YANKO NO.:  [id number]   MEDICAL RECORD NO.:  [id number]          PATIENT TYPE:  AMB   LOCATION:  DAY                          FACILITY:  [facility]   PHYSICIAN:  Angelia Mould. Derrell Lolling, M.D.DATE OF BIRTH:  Dec 23, 1955   DATE OF PROCEDURE:  09/20/2008  DATE OF DISCHARGE:                               OPERATIVE REPORT   PREOPERATIVE DIAGNOSIS:  Umbilical hernia.   POSTOPERATIVE DIAGNOSIS:  Umbilical hernia.   OPERATION PERFORMED:  Repair of umbilical hernia with Ventralex mesh.   SURGEON:  Angelia Mould. Derrell Lolling, M.D.   ANESTHESIA:   INDICATIONS FOR PROCEDURE:  The patient is a 55 year old white male who  has had an umbilical hernia for many years.  He has been working out  more in [employer]  This has begun to hurt more.  He wants to have it  repaired.  He had coronary artery disease which is stable at this time.  On exam, he is a large man 6 feet 4 inches, 298 pounds and he has a  moderate to small umbilical hernia which is reducible.  No other hernias  were noted.  He has been counseled as an outpatient.  He is brought to  the operating room electively.   DESCRIPTION OF PROCEDURE:  Following the induction of general  endotracheal anesthesia, we held a time out identifying the correct  patient, correct procedure.  Intravenous antibiotics were given.  The  abdomen was prepped and draped in a sterile fashion.  A curved  transverse incision was made just outside the lower rim of the  umbilicus.  Dissection was carried down through the subcutaneous tissue,  down to the anterior abdominal wall fascia at the base of the umbilicus.  The umbilical skin was dissected away from the umbilical hernia sac.  The umbilical hernia sac was entered and there was no incarcerated  bowel.  We debrided the sac.  We cleaned off the subcutaneous tissue off  of the anterior abdominal wall fascia circumferentially. We chose to  repair the defect with mesh.   The defect was about 2  cm to 2.5 cm in size.  We used a medium-sized  piece of Ventralex hernia patch which was 6.4 cm in diameter,  polypropylene toward the abdominal wall and PTFE toward the abdominal  viscera.  This was placed underneath the fascia.  This was sutured in  place with 4 interrupted sutures of 0 Novofil which held the mesh out in  all four quadrants smoothly and without wrinkling.  The tails of the  Ventralex mesh were then cut off.  The fascia was closed transversely  with three interrupted sutures of #1 Novofil.  The wound was irrigated  with saline.  The umbilicus was tacked back down to the fascia with a 3-  0 Vicryl suture.  The subcutaneous tissue was closed  with 3-0 Vicryl sutures.  The skin was closed with a running  subcuticular suture of 4-0 Monocryl and Dermabond.  A clean gauze  bandage was placed.  The patient was taken to the recovery room in  stable condition.  The estimated  blood loss was about 10 mL.  Complications were none.  Sponge, needle and instrument counts were  correct.      Angelia Mould. Derrell Lolling, M.D.  Electronically Signed     HMI/MEDQ  D:  09/20/2008  T:  09/20/2008  Job:  [id number]   cc:   Lonie Peak, [facility]   Thereasa Solo. Little, M.D.  Fax: [phone]

## 2011-04-12 ENCOUNTER — Encounter (HOSPITAL_COMMUNITY): Payer: 59

## 2011-04-13 ENCOUNTER — Encounter (HOSPITAL_COMMUNITY): Payer: 59

## 2011-04-13 NOTE — Discharge Summary (Signed)
NAME:  Bobby Pena, Bobby Pena NO.:  [id number]   MEDICAL RECORD NO.:  [id number]                   PATIENT TYPE:  INP   LOCATION:  2005                                 FACILITY:  [facility]   PHYSICIAN:  Thereasa Solo. Little, M.D.              DATE OF BIRTH:  Feb 16, 1956   DATE OF ADMISSION:  06/19/2003  DATE OF DISCHARGE:  06/21/2003                                 DISCHARGE SUMMARY   ADMISSION DIAGNOSIS:  Chest pain.   DISCHARGE DIAGNOSES:  1. Chest pain, questionable noncardiac in origin, no evidence of myocardial     infarction.  2. Coronary artery disease.  3. Hyperlipidemia.  4. Sleep apnea.  5. Persistent cigarette abuse.  6. Hyperlipidemia.  7. Anxiety/depression.   PROCEDURE:  Left heart catheterization.   COMPLICATIONS:  None.   DISCHARGE STATUS:  Improved without chest pain.   HISTORY AND PHYSICAL EXAM:  See dictated note on the chart.  Briefly, Mr.  Pena is a 55 year old male who was hospitalized with very atypical pain on  April 27, 2003, and had a stent placed into the OM system.  This was a Sypher  stent that requires Plavix and aspirin for anticoagulant for a minimum of  six months.  He inadvertently ran out of Plavix about 7-10 days prior to  this admission and began having chest pain, when he presented to the  emergency room.  His examination was unremarkable, except for marked  obesity.  His initial blood pressure in the ER was 165/89.  His lungs were  clear.  His cardiac distant heart sounds.  Good pulses, no edema.   LABORATORY DATA:  The EKG was normal.  CMP was normal.  Troponin and CKs  were negative.  His TSH was normal at 2.34.  His lipids were markedly  elevated.  His total cholesterol was 198, but his triglycerides were 874 and  his LDL was depressed at only 27.  He takes 80 mg of Lipitor.   Chest x-ray result is pending at the time of this dictation.   The patient was immediately started on IV heparin and IV Integrelin and  given 300 mg of oral Plavix in addition to his aspirin.  The concern was  that he may have subacute thrombosis in the Sypher stent because of the  discontinuation of the Plavix.   His cardiac enzymes ruled out for any evidence of myocardial injury and he  had only mild discomfort and shortness of breath the following morning.  He  was brought to the catheterization laboratory on June 21, 2003, and had a  diagnostic left heart catheterization that showed the stent to be widely  patent with no evidence of thrombosis.  In addition to this, he has only  mild coronary disease in his other native vessels.  He does, however, have  relatively small vessels with fairly diffuse disease in the distal portions.  His LV function  was normal.   In view of his catheterization findings, the patient was felt to be stable  for discharge late this afternoon.   DISCHARGE MEDICATIONS:  1. Plavix 75 mg a day.  2. Enteric coated aspirin 81 mg a day.  3. Lipitor 80 mg a day.  4. Allegra 180 mg a day.  5. Lexapro 10 mg a day.  6. Celexa 200 mg b.i.d.  7. Nitroglycerin on a p.r.n. basis.  8. He takes over-the-counter Prilosec for intermittent indigestion-type     symptoms.   FOLLOW UP:  I plan to re-evaluate him in my office tomorrow to make sure his  catheterization site is well healed.  I will discuss his lipid abnormality  with his primary care doctor, Bobby Pena, M.D., who has been managing this.   I had a long discussion about the importance of risk factor management,  including stopping smoking, losing weight, and improvement in his exercise  regimen.  The long-term outlook is relatively good provided he will make  lifestyle changes.  In addition to this, he has known sleep apnea.  He has  been instructed to continue his sleep apnea device at night.                                                Thereasa Solo. Little, M.D.    ABL/MEDQ  D:  06/21/2003  T:  06/21/2003  Job:  [id number]   cc:   Kizzie Furnish, M.D.  [address]  [place]  [place] [address]  Fax: [phone]

## 2011-04-13 NOTE — Op Note (Signed)
NAME:  Bobby Pena, Bobby Pena NO.:  [id number]   MEDICAL RECORD NO.:  [id number]                   PATIENT TYPE:  OIB   LOCATION:  NA                                   FACILITY:  [facility]   PHYSICIAN:  Thera Flake., M.D.             DATE OF BIRTH:  [birth year]   DATE OF PROCEDURE:  04/05/2004  DATE OF DISCHARGE:                                 OPERATIVE REPORT   INDICATIONS FOR PROCEDURE:  A 55 year old with a rotator cuff partial versus  complete tear of the right shoulder.  He was thought to be a candidate for  outpatient surgery with overnight in the hospital setting due to sleep  apnea.   PREOPERATIVE DIAGNOSIS:  1. Partial thickness rotator cuff tear, supraspinatus.  2. Degenerative tear in the anterior superior inferior labrum.  3. Impingement.  4. AC joint arthritis.   PROCEDURE:  1. Arthroscopic acromioplasty.  2. Arthroscopic debridement of torn labrum.  3. Arthroscopic excision of distal clavicle.   SURGEON:  Dyke Brackett, M.D.   ASSISTANT:  Legrand Pitts. Duffy, P.A.   DESCRIPTION OF PROCEDURE:  Sterile prep and drape in the beach chair  position after Sheldon Silvan, M.D. had administered general anesthesia with  intubation.  He was arthroscoped through a posterolateral anterior portal.  Systemic inspection of the glenohumeral joint showed no obvious degenerative  change of the glenohumeral joint.  There was a lot of friable tearing, but  no instability of the anterior superior inferior labrum.  Inferior labrum  was debrided.  Partial thickness cuff tear approaching 30 to 40% of probably  the first 1/3 of the supraspinatus, not nearly a full thickness tear. This  was debrided.  The subacromial space was hypertrophied, thickened, and  inflamed with a very prominent CA ligament, moderately severe impingement  from the [facility] joint as well as the acromion.  Acromioplasty was carried out  with a high speed bur resecting about 7 to 8 mm of the anterior  leading edge  of the acromion.  The resection line checked from the posterior and lateral  portals.  Distal 1 cm of the clavicle was excised to relieve the clavicular  impingement.  Superior surface of the cuff was thoroughly inspected and  while friable, did not appear to be any full thickness areas.  Shoulder  drained free of fluid.  Portals were closed with nylon and a lightly  compressive sterile dressing applied and taken to the recovery room in  stable condition.                                               Thera Flake., M.D.    WDC/MEDQ  D:  04/05/2004  T:  04/06/2004  Job:  [id number]

## 2011-04-13 NOTE — Cardiovascular Report (Signed)
NAME:  Bobby Pena, Bobby Pena                         ACCOUNT NO.:  [id number]   MEDICAL RECORD NO.:  [id number]                   PATIENT TYPE:  OIB   LOCATION:  2856                                 FACILITY:  [facility]   PHYSICIAN:  Thereasa Solo. Little, M.D.              DATE OF BIRTH:  [birth year]   DATE OF PROCEDURE:  04/27/2003  DATE OF DISCHARGE:                              CARDIAC CATHETERIZATION   INDICATIONS FOR TEST:  The patient is a 55 year old male who had atypical  chest discomfort in [place].  Had a nuclear study performed by [employer] that raised the concern of inferior ischemia.  He is brought in  for outpatient cardiac catheterization.   PROCEDURE:  The patient was prepped and draped in the usual sterile fashion  exposing the right groin.  Following local anesthetic 1% Xylocaine the  Seldinger technique was employed and the 5-French introducer sheath was  placed into the right femoral artery.  Left and right coronary arteriography  and ventriculography in the RAO projection was performed.   RESULTS:  HEMODYNAMIC MONITORING:  Central aortic pressure was 129/85.  Left ventricular pressure was 141/31  and there was an 11 mm aortic valve gradient noted at the time of pullback.   VENTRICULOGRAPHY  Ventriculography in the RAO projection using 25 mL of contrast at 12 mL per  second revealed normal left ventricular systolic function, ejection fraction  62%.  No mitral regurgitation was seen.  The left ventricular end-diastolic  pressure was elevated at 31.   CORONARY ANGIOGRAPHY  Faint calcification was seen on fluoroscopy in the distribution of LAD.   1. Left main normal.  2. LAD:  The LAD extended down to the apex of the heart and gave rise to two     diagonal branches.  The diagonals were free of disease as was the     proximal LAD.  Just after the second diagonal was a small area of 40%     narrowing in the LAD itself.  3. Circumflex:  The circumflex was a very  large dominant vessel.  The     proximal portion of the vessel approached 4 mm.  It gave rise to three OM     vessels and a PDA.  OM number two in the distal portion of the mid     segment had an area of 95% narrowing.  This vessel filled late and the     distal portion of the vessel was under filled.  There was TIMI-1 flow.     The remainder of the circumflex system was free of disease.  4. Right coronary artery:  The right coronary artery was a nondominant     vessel supplying only the right ventricle.  There was a less than 40%     area of narrowing in the mid portion.   Because of the high grade stenosis and diminished flow in OM number  two,  arrangements were made for urgent intervention.  The 5-French sheath that  had been placed previously was exchanged out for a 6-French sheath.  A JL4 6-  [place] guide catheter and a short Luge wire was used.  The patient was given  6600 units of IV heparin and Integrilin double bolus.  Once his ACT was  greater than 200 the Luge wire was placed down the circumflex system, down  OM number two into the distal portion of the vessel.  A 2.5 x 15 CrossSail  balloon was then placed in the area of obstruction and a single inflation of  10 atmospheres for 60 seconds was performed.  Following the predilatation  angioplasty a stent was made ready.   A 3.0 x 18 Cypher stent was placed in such a manner that most of the  proximal and distal portion of the vessel was well covered.  It was  initially deployed at 13 atmospheres for 60 seconds with the final inflation  being 16 atmospheres for 60 seconds.  Following the intervention the 95%  area of narrowing was now less than normal.  There was TIMI-3 flow post  event and TIMI-1 flow pre event.  There was no evidence of any dissection or  thrombus.   The patient will be maintained on Integrilin for 12 additional hours.  He  was given 300 mg of oral Plavix in the catheterization laboratory.  He was  also  sedated with 2 mg of Versed.  He should be ready for discharge in the  morning.                                               Thereasa Solo. Little, M.D.    ABL/MEDQ  D:  04/27/2003  T:  04/27/2003  Job:  [id number]   cc:   Kizzie Furnish, M.D.  [address]  [place]  [place] [address]  Fax: [phone]   Cardiac Cath Lab   [facility]

## 2011-04-13 NOTE — Cardiovascular Report (Signed)
NAME:  Bobby Pena, Bobby Pena NO.:  [id number]   MEDICAL RECORD NO.:  [id number]                   PATIENT TYPE:  INP   LOCATION:  2005                                 FACILITY:  [facility]   PHYSICIAN:  Thereasa Solo. Little, M.D.              DATE OF BIRTH:  [birth year]   DATE OF PROCEDURE:  06/21/2003  DATE OF DISCHARGE:                              CARDIAC CATHETERIZATION   INDICATIONS FOR TEST:  The patient is a 55 year old male who had a Cypher  stent placed to his obtuse marginal vessel April 27, 2003.  He was instructed  to stay on Plavix for a minimum of six months, but ran out of Plavix about  10 days ago, did not get it refilled, and presented with chest pain on June 19, 2003.  Cardiac enzymes were negative.  He has been on Integrilin,  Plavix, aspirin, and heparin since his admission.  He is brought to the  catheterization laboratory to rule out subacute thrombosis of the Cypher  stent as the explanation for his chest pain.   PROCEDURE:  The patient was prepped and draped in the usual sterile fashion  exposing the right groin.  Following local anesthetic 1% Xylocaine the  Seldinger technique was employed and the 5-French introducer sheath was  placed into the right femoral artery.  Left and right coronary arteriography  and ventriculography in the RAO projection was performed.   TOTAL CONTRAST:  130 mL.   COMPLICATIONS:  None.   EQUIPMENT:  5-French Judkins configuration catheters.   RESULTS:  HEMODYNAMIC MONITORING:  Central aortic pressure was 120/75.  Left ventricular pressure was 121/9  with no significant valve gradient noted at time of pullback.   VENTRICULOGRAPHY  Ventriculography in the RAO projection revealed normal left ventricular  systolic function.  There was mild left ventricular hypertrophy.  The  ejection fraction was greater than 60%.  The end-diastolic pressure was  slightly increased at 18.   CORONARY ARTERIOGRAPHY  Minimal  calcification was seen on fluoroscopy and the stent was noted in the  distribution of the OM system.  1. Left main normal.  2. LAD:  The LAD was smaller in diameter than circumflex system.  It gave     rise to one diagonal vessel.  This was also a small vessel.  The distal     LAD was also small with diffuse irregularities in the distal half of the     LAD system.  No high grade stenosis was seen.  3. Circumflex:  The circumflex was a very large, dominant vessel.  It gave     rise to one very large bifurcating OM vessel that covered the entire     lateral wall.  The stent which was in the lower bifurcation of the OM     system was widely patent.  The vessel distal to the stent was relatively     small  in diameter with relatively diffuse irregularities.  No high grade     stenosis was seen.  The ongoing circumflex and PDA had only minimal     irregularities.  4. Right coronary artery:  Nondominant supplying only right ventricular free     wall.   CONCLUSION:  1. Widely patent stent in the OM system with no evidence of thrombosis.  2. Diffuse mild irregularities in the circumflex LAD system.  3. Normal left ventricular systolic function.   The patient has been reinstructed to stay on his Plavix religiously for a  minimum of six months.  He will come to my office for follow-up of his  catheterization site tomorrow.  His lipids are markedly elevated and I will  discuss this in detail with his primary care doctor who is managing this.                                               Thereasa Solo. Little, M.D.    ABL/MEDQ  D:  06/21/2003  T:  06/21/2003  Job:  [id number]  Kizzie Furnish, M.D.  [address]  [place]  [place] [address]  Fax: [phone]   Cath Lab   cc:   Kizzie Furnish, M.D.  [address]  [place]  [place] [address]  Fax: [phone]   Cath Lab

## 2011-04-13 NOTE — Op Note (Signed)
NAMEANTWAUN, BUTH NO.:  [id number]   MEDICAL RECORD NO.:  [id number]          PATIENT TYPE:  OIB   LOCATION:  2899                         FACILITY:  [facility]   PHYSICIAN:  Thera Flake., M.D.DATE OF BIRTH:  05-18-1956   DATE OF PROCEDURE:  04/18/2005  DATE OF DISCHARGE:                                 OPERATIVE REPORT   INDICATIONS:  This patient is a 55 year old status post right shoulder  surgery with a rotator cuff problem, now with left shoulder pain consistent  with possible cuff tear thought to be amenable to overnight hospitalization  due to a history of sleep apnea.   PRE AND POSTOP DIAGNOSIS:  1. Nearly full thickness rotator cuff tear left shoulder.  2. Labral tear.  3. Impingement.  4. Acromioclavicular joint arthritis.     OPERATION:  1. Open rotator cuff repair with acromioplasty.  2. Arthroscopic debridement torn labrum.  3. Open distal clavicle excision.     SURGEON:  Dyke Brackett, M.D.   ASSISTANTChestine Spore, P.A.   BLOOD LOSS:  Minimal.   DESCRIPTION OF PROCEDURE:  Arthroscope through a posterior anterior portal  systematic space of the shoulder, nearly complete cuff tear.  This was  probably over a distance of about 5-6 mm, essentially kind of hanging by a  thread type, nearly full thickness, degenerative tearing of the anterior,  superior, mid inferior labrum was debrided.  Biceps __________ was intact  and anchored nicely, there was no degenerative change.  Patient had  significant AC arthritis in a very large clavicle and in view of this fact  this procedure was next converted to open procedure.  Incision made  bisecting the AC joint interval in line almost parallel to the acromion,  deltoid split about 2-3 cm distal to the acromion.  Acromion was moderately  thickened and laterally sloped.  Acromioplasty was carried out with incision  about 1.5 cm in the distal clavicle and the cuff tear was nearly full  thickness, it was  split longitudinally.  The tuberosity was burred and then  a 5.5 mm Arthrex anchor placed.  A #2 FiberWire was attached to basically  reimplant the partially torn tissue on fresh burred bone.  The edges were  oversewn with 2-0 FiberWire and bone wax placed on the resection __________  of the bone.  The split deltoid was meticulously painted with #1 Ethibond, 2-  0 Vicryl, Monocryl, a light compressive sterile dressing applied and a mini  pillow splint.  Taken to recovery room in stable condition.      WDC/MEDQ  D:  04/18/2005  T:  04/18/2005  Job:  [id number]

## 2011-04-16 ENCOUNTER — Ambulatory Visit: Payer: 59 | Admitting: Pulmonary Disease

## 2011-04-17 ENCOUNTER — Encounter (HOSPITAL_COMMUNITY): Payer: 59

## 2011-04-19 ENCOUNTER — Encounter (HOSPITAL_COMMUNITY): Payer: 59

## 2011-04-20 ENCOUNTER — Encounter (HOSPITAL_COMMUNITY): Payer: 59

## 2011-04-24 ENCOUNTER — Encounter (HOSPITAL_COMMUNITY): Payer: 59

## 2011-04-26 ENCOUNTER — Encounter (HOSPITAL_COMMUNITY): Payer: 59

## 2011-04-27 ENCOUNTER — Encounter (HOSPITAL_COMMUNITY): Payer: 59 | Attending: Cardiovascular Disease

## 2011-04-27 DIAGNOSIS — E785 Hyperlipidemia, unspecified: Secondary | ICD-10-CM | POA: Insufficient documentation

## 2011-04-27 DIAGNOSIS — Z5189 Encounter for other specified aftercare: Secondary | ICD-10-CM | POA: Insufficient documentation

## 2011-04-27 DIAGNOSIS — F329 Major depressive disorder, single episode, unspecified: Secondary | ICD-10-CM | POA: Insufficient documentation

## 2011-04-27 DIAGNOSIS — R0602 Shortness of breath: Secondary | ICD-10-CM | POA: Insufficient documentation

## 2011-04-27 DIAGNOSIS — I2510 Atherosclerotic heart disease of native coronary artery without angina pectoris: Secondary | ICD-10-CM | POA: Insufficient documentation

## 2011-04-27 DIAGNOSIS — I252 Old myocardial infarction: Secondary | ICD-10-CM | POA: Insufficient documentation

## 2011-04-27 DIAGNOSIS — I10 Essential (primary) hypertension: Secondary | ICD-10-CM | POA: Insufficient documentation

## 2011-04-27 DIAGNOSIS — K219 Gastro-esophageal reflux disease without esophagitis: Secondary | ICD-10-CM | POA: Insufficient documentation

## 2011-04-27 DIAGNOSIS — G4733 Obstructive sleep apnea (adult) (pediatric): Secondary | ICD-10-CM | POA: Insufficient documentation

## 2011-04-27 DIAGNOSIS — F3289 Other specified depressive episodes: Secondary | ICD-10-CM | POA: Insufficient documentation

## 2011-05-01 ENCOUNTER — Encounter (HOSPITAL_COMMUNITY): Payer: 59

## 2011-05-02 ENCOUNTER — Other Ambulatory Visit: Payer: Self-pay | Admitting: *Deleted

## 2011-05-03 ENCOUNTER — Encounter (HOSPITAL_COMMUNITY): Payer: 59

## 2011-05-03 ENCOUNTER — Encounter: Payer: Self-pay | Admitting: *Deleted

## 2011-05-03 ENCOUNTER — Other Ambulatory Visit: Payer: Self-pay | Admitting: Family Medicine

## 2011-05-03 MED ORDER — OXYCODONE-ACETAMINOPHEN 7.5-500 MG PO TABS: 1.0000 | ORAL_TABLET | Freq: Four times a day (QID) | ORAL | Status: DC | PRN

## 2011-05-03 NOTE — Telephone Encounter (Signed)
Per patient Bobby Pena will only allow him to fill 120 tablets at a time.  Patient signed controlled substance contract and was advised that he can only get pain medication from one doctor.  He stated that he wants Dr. Dayton Martes to manage all of his pain medication and he can only use once pharmacy unless its a special circumstance.  In his situation he does a lot of work out of town and stays gone for weeks at a time.

## 2011-05-04 ENCOUNTER — Encounter (HOSPITAL_COMMUNITY): Payer: 59

## 2011-05-08 ENCOUNTER — Encounter (HOSPITAL_COMMUNITY): Payer: 59

## 2011-05-10 ENCOUNTER — Encounter (HOSPITAL_COMMUNITY): Payer: 59

## 2011-05-11 ENCOUNTER — Encounter (HOSPITAL_COMMUNITY): Payer: 59

## 2011-05-15 ENCOUNTER — Encounter (HOSPITAL_COMMUNITY): Payer: 59

## 2011-05-17 ENCOUNTER — Encounter (HOSPITAL_COMMUNITY): Payer: 59

## 2011-05-18 ENCOUNTER — Encounter (HOSPITAL_COMMUNITY): Payer: 59

## 2011-05-22 ENCOUNTER — Encounter (HOSPITAL_COMMUNITY): Payer: 59

## 2011-05-24 ENCOUNTER — Encounter (HOSPITAL_COMMUNITY): Payer: 59

## 2011-05-25 ENCOUNTER — Encounter (HOSPITAL_COMMUNITY): Payer: 59

## 2011-05-28 ENCOUNTER — Other Ambulatory Visit: Payer: Self-pay | Admitting: *Deleted

## 2011-05-28 MED ORDER — OXYCODONE-ACETAMINOPHEN 7.5-500 MG PO TABS: 1.0000 | ORAL_TABLET | Freq: Four times a day (QID) | ORAL | Status: DC | PRN

## 2011-05-28 NOTE — Telephone Encounter (Signed)
Wife says that they are living back and forth between there home here and in [place]. She says that right now they are in [place] and is asking if this rx can be mailed to them. She says that they discussed this with you at his last office visit and you told them that we could. Please advise. She wants it mailed to:  [address] [place], [place] [address]

## 2011-05-28 NOTE — Telephone Encounter (Signed)
Called Candice and left message on cell phone voicemail that Dr. Dayton Martes agrees to mail Rx this one time but they must figure out another way for him to get his pain medication.

## 2011-05-29 ENCOUNTER — Encounter (HOSPITAL_COMMUNITY): Payer: 59

## 2011-05-31 ENCOUNTER — Encounter (HOSPITAL_COMMUNITY): Payer: 59

## 2011-06-01 ENCOUNTER — Encounter (HOSPITAL_COMMUNITY): Payer: 59

## 2011-06-05 ENCOUNTER — Encounter (HOSPITAL_COMMUNITY): Payer: 59

## 2011-06-07 ENCOUNTER — Encounter (HOSPITAL_COMMUNITY): Payer: 59

## 2011-06-08 ENCOUNTER — Encounter (HOSPITAL_COMMUNITY): Payer: 59

## 2011-06-11 ENCOUNTER — Encounter: Payer: Self-pay | Admitting: Family Medicine

## 2011-06-11 ENCOUNTER — Ambulatory Visit (INDEPENDENT_AMBULATORY_CARE_PROVIDER_SITE_OTHER): Payer: 59 | Admitting: Family Medicine

## 2011-06-11 VITALS — BP 150/82 | HR 69 | Temp 98.4°F | Wt 306.2 lb

## 2011-06-11 DIAGNOSIS — E785 Hyperlipidemia, unspecified: Secondary | ICD-10-CM

## 2011-06-11 DIAGNOSIS — M549 Dorsalgia, unspecified: Secondary | ICD-10-CM

## 2011-06-11 DIAGNOSIS — I10 Essential (primary) hypertension: Secondary | ICD-10-CM

## 2011-06-11 LAB — BASIC METABOLIC PANEL
BUN: 19 mg/dL (ref 6–23)
Creatinine, Ser: 1.1 mg/dL (ref 0.4–1.5)
GFR: 77.36 mL/min (ref >60.00)
Potassium: 4.3 mEq/L (ref 3.5–5.1)

## 2011-06-11 LAB — LIPID PANEL: Cholesterol: 185 mg/dL (ref 0–200)

## 2011-06-11 MED ORDER — OXYCODONE-ACETAMINOPHEN 7.5-500 MG PO TABS: 1.0000 | ORAL_TABLET | Freq: Four times a day (QID) | ORAL | Status: DC | PRN

## 2011-06-11 NOTE — Progress Notes (Signed)
55 year old here to discuss chronic back pain.  Has been seeing Dr. Patsy Lager for left knee pain, wearing a brace which has not been helping much.  Has a history of left arthroscopy, knee, probable partial meniscectomy and probable loose body removal 3 years ago.  Also has h/o chronic low back pain, multiple disc issues in lumbar region, declined surgery in past.  On Hydrocodone, not abusing it. Takes the edge off.  Occasionally has lower extremity radiculopathy.  No urinary retention or saddle anesthesia.  BP elevated.  Has not taken any medications this morning.  The PMH, PSH, Social History, Family History, Medications, and allergies have been reviewed in [facility], and have been updated if relevant.  REVIEW OF SYSTEMS  GEN: No fevers, chills. Nontoxic. Primarily MSK c/o today. MSK: Detailed in the HPI GI: tolerating PO intake without difficulty Neuro: No numbness, parasthesias, or tingling associated. Otherwise the pertinent positives of the ROS are noted above.   PHYSICAL EXAM: BP 150/82  Pulse 69  Temp(Src) 98.4 F (36.9 C) (Oral)  Wt 306 lb 4 oz (138.914 kg) GEN: Well-developed,well-nourished,in no acute distress; alert,appropriate and cooperative throughout examination HEENT: Normocephalic and atraumatic without obvious abnormalities. Ears, externally no deformities PULM: Breathing comfortably in no respiratory distress EXT: No clubbing, cyanosis, or edema PSYCH: Normally interactive. Cooperative during the interview. Pleasant. Friendly and conversant. Not anxious or depressed appearing. Normal, full affect. MSK:  Left knee in brace- did not examen today  SLR pos right, neg left.  Walks with limp. DTS equal.  Assessment and Plan: 1. BACK PAIN     Stable.  Hydrocodone refilled. Not abusing pain contract.

## 2011-06-12 ENCOUNTER — Encounter (HOSPITAL_COMMUNITY): Payer: 59

## 2011-06-14 ENCOUNTER — Encounter (HOSPITAL_COMMUNITY): Payer: 59

## 2011-06-15 ENCOUNTER — Other Ambulatory Visit: Payer: Self-pay | Admitting: *Deleted

## 2011-06-15 ENCOUNTER — Encounter (HOSPITAL_COMMUNITY): Payer: 59

## 2011-06-15 MED ORDER — ROSUVASTATIN CALCIUM 10 MG PO TABS: 10.0000 mg | ORAL_TABLET | Freq: Every day | ORAL | Status: DC

## 2011-06-15 MED ORDER — AMLODIPINE BESYLATE 5 MG PO TABS: 5.0000 mg | ORAL_TABLET | Freq: Every day | ORAL | Status: DC

## 2011-06-15 MED ORDER — FENOFIBRATE 145 MG PO TABS: 145.0000 mg | ORAL_TABLET | Freq: Every day | ORAL | Status: DC

## 2011-06-15 MED ORDER — OMEPRAZOLE 20 MG PO CPDR: 20.0000 mg | DELAYED_RELEASE_CAPSULE | Freq: Every day | ORAL | Status: AC

## 2011-06-15 MED ORDER — METOPROLOL TARTRATE 25 MG PO TABS: 25.0000 mg | ORAL_TABLET | Freq: Every day | ORAL | Status: DC

## 2011-06-15 MED ORDER — MONTELUKAST SODIUM 10 MG PO TABS: 10.0000 mg | ORAL_TABLET | Freq: Every day | ORAL | Status: DC

## 2011-06-15 NOTE — Telephone Encounter (Signed)
Patient is asking for a 90 days supply for all of these.

## 2011-06-16 MED ORDER — OXYCODONE-ACETAMINOPHEN 7.5-500 MG PO TABS: 1.0000 | ORAL_TABLET | Freq: Four times a day (QID) | ORAL | Status: DC | PRN

## 2011-06-16 MED ORDER — ESCITALOPRAM OXALATE 20 MG PO TABS: 20.0000 mg | ORAL_TABLET | Freq: Every day | ORAL | Status: DC

## 2011-06-16 MED ORDER — RANOLAZINE ER 500 MG PO TB12: 500.0000 mg | ORAL_TABLET | Freq: Two times a day (BID) | ORAL | Status: DC

## 2011-06-16 MED ORDER — CELECOXIB 200 MG PO CAPS: 200.0000 mg | ORAL_CAPSULE | Freq: Every day | ORAL | Status: DC

## 2011-06-19 ENCOUNTER — Other Ambulatory Visit: Payer: Self-pay | Admitting: *Deleted

## 2011-06-19 ENCOUNTER — Encounter (HOSPITAL_COMMUNITY): Payer: 59

## 2011-06-19 MED ORDER — CLOPIDOGREL BISULFATE 75 MG PO TABS: 75.0000 mg | ORAL_TABLET | Freq: Every day | ORAL | Status: DC

## 2011-06-21 ENCOUNTER — Encounter (HOSPITAL_COMMUNITY): Payer: 59

## 2011-06-21 MED ORDER — OXYCODONE-ACETAMINOPHEN 7.5-500 MG PO TABS: 1.0000 | ORAL_TABLET | Freq: Four times a day (QID) | ORAL | Status: DC | PRN

## 2011-06-21 NOTE — Telephone Encounter (Signed)
Could not locate original Rx, Rx reprinted.  [name] Gillium will pick up this Rx today for Mr. Hoskin.

## 2011-06-21 NOTE — Telephone Encounter (Signed)
Addended by: Gilmer Mor on: 06/21/2011 09:07 AM   Modules accepted: Orders

## 2011-06-22 ENCOUNTER — Encounter (HOSPITAL_COMMUNITY): Payer: 59

## 2011-06-22 ENCOUNTER — Other Ambulatory Visit: Payer: Self-pay | Admitting: *Deleted

## 2011-06-22 ENCOUNTER — Telehealth: Payer: Self-pay | Admitting: *Deleted

## 2011-06-22 MED ORDER — ROSUVASTATIN CALCIUM 40 MG PO TABS: 40.0000 mg | ORAL_TABLET | Freq: Every day | ORAL | Status: DC

## 2011-06-22 NOTE — Telephone Encounter (Signed)
Gennaro Africa at [facility] that patient signed a controlled substance contract here in the office and Dr. Dayton Martes will no longer refill his pain medication, Rx denied.  She advised that patient has filled this medication at [facility] and now [facility].  She will notify patient.

## 2011-06-22 NOTE — Telephone Encounter (Signed)
[facility] called to make sure that it was okay to fill the percocet since he just had filled on 06-05-11. She also wanted you to be aware that he last had this filled at [facility] and is now trying to fill at [facility].

## 2011-06-25 ENCOUNTER — Other Ambulatory Visit: Payer: Self-pay | Admitting: *Deleted

## 2011-06-25 MED ORDER — OXYCODONE-ACETAMINOPHEN 7.5-500 MG PO TABS: 1.0000 | ORAL_TABLET | Freq: Three times a day (TID) | ORAL | Status: DC | PRN

## 2011-06-25 NOTE — Telephone Encounter (Signed)
Ok to fill Bobby Pena prescription for the 90 day supply as originally prescribed then. I was under the impression he filled at two pharmacies. As we told Bobby Pena, we cannot shop at two pharmacies- that is breaking the pain contract and from now on, only 30 day supplies.

## 2011-06-25 NOTE — Telephone Encounter (Signed)
Spoke with patient regarding Mr. Bobby Pena.  [facility] stated that [facility] since per Dr. Elmer Sow request.  She stated that patient will need a new Rx, written for 30 days only.  She will pick up this Rx tomorrow.  Please advise.

## 2011-06-25 NOTE — Telephone Encounter (Signed)
Spoke to pharmacist Cleone Slim) at [facility] and was informed that he was never given the hard copy of the prescription for patient. Rob stated that he was given a written prescription for Bobby Pena's pain medication and he informed her that he could not get insurance to pay for more than a 30 day supply. Rob did suggest that she [facility] and see if they could fill it for a 90 day supply since they are owned by the same company and maybe they could get this approved for a retail 90 day supply. Rob states that Bobby Pena shops between his pharmacy and [facility].

## 2011-06-25 NOTE — Telephone Encounter (Signed)
Ms. Bobby Pena called requesting a new script for patient's pain medication. Ms. Bobby Pena states that he was given a prescription for this and [facility] was [facility]. Ms. Bobby Pena stated that she had gone to [facility] and was not able to get more than a 30 day supply and was instructed that [facility] may be able to fill it for a longer period of time for the patient since they are associated with [facility].  Was informed that they have not been shopping around and the only reason that they [facility] is because the [facility]ist at [facility] suggested it. Patient's insurance will expire tomorrow and they want to pick up a new script for his pain medication before tomorrow. Please advise.

## 2011-06-26 ENCOUNTER — Encounter (HOSPITAL_COMMUNITY): Payer: 59

## 2011-06-28 ENCOUNTER — Encounter (HOSPITAL_COMMUNITY): Payer: 59

## 2011-06-29 ENCOUNTER — Encounter (HOSPITAL_COMMUNITY): Payer: 59

## 2011-07-03 ENCOUNTER — Encounter (HOSPITAL_COMMUNITY): Payer: 59

## 2011-07-05 ENCOUNTER — Encounter (HOSPITAL_COMMUNITY): Payer: 59

## 2011-07-06 ENCOUNTER — Encounter (HOSPITAL_COMMUNITY): Payer: 59

## 2011-07-10 ENCOUNTER — Encounter (HOSPITAL_COMMUNITY): Payer: 59

## 2011-07-12 ENCOUNTER — Encounter (HOSPITAL_COMMUNITY): Payer: 59

## 2011-07-13 ENCOUNTER — Encounter (HOSPITAL_COMMUNITY): Payer: 59

## 2011-07-17 ENCOUNTER — Encounter (HOSPITAL_COMMUNITY): Payer: 59

## 2011-07-19 ENCOUNTER — Encounter (HOSPITAL_COMMUNITY): Payer: 59

## 2011-07-20 ENCOUNTER — Encounter (HOSPITAL_COMMUNITY): Payer: 59

## 2011-07-24 ENCOUNTER — Encounter (HOSPITAL_COMMUNITY): Payer: 59

## 2011-07-25 ENCOUNTER — Other Ambulatory Visit: Payer: Self-pay | Admitting: *Deleted

## 2011-07-25 MED ORDER — RANOLAZINE ER 500 MG PO TB12: 500.0000 mg | ORAL_TABLET | Freq: Two times a day (BID) | ORAL | Status: DC

## 2011-07-25 MED ORDER — CLOPIDOGREL BISULFATE 75 MG PO TABS: 75.0000 mg | ORAL_TABLET | Freq: Every day | ORAL | Status: DC

## 2011-07-25 MED ORDER — OXYCODONE-ACETAMINOPHEN 7.5-500 MG PO TABS: 1.0000 | ORAL_TABLET | Freq: Three times a day (TID) | ORAL | Status: DC | PRN

## 2011-07-25 MED ORDER — ESCITALOPRAM OXALATE 20 MG PO TABS: 20.0000 mg | ORAL_TABLET | Freq: Every day | ORAL | Status: DC

## 2011-07-25 MED ORDER — CELECOXIB 200 MG PO CAPS: 200.0000 mg | ORAL_CAPSULE | Freq: Every day | ORAL | Status: DC

## 2011-07-25 MED ORDER — AMLODIPINE BESYLATE 5 MG PO TABS: 5.0000 mg | ORAL_TABLET | Freq: Every day | ORAL | Status: DC

## 2011-07-25 MED ORDER — METOPROLOL TARTRATE 25 MG PO TABS: 25.0000 mg | ORAL_TABLET | Freq: Every day | ORAL | Status: DC

## 2011-07-25 NOTE — Telephone Encounter (Signed)
Patient request 90 day supply

## 2011-07-25 NOTE — Telephone Encounter (Signed)
Please call pt when ready.

## 2011-07-26 ENCOUNTER — Encounter (HOSPITAL_COMMUNITY): Payer: 59

## 2011-07-27 ENCOUNTER — Encounter: Payer: Self-pay | Admitting: Family Medicine

## 2011-07-27 ENCOUNTER — Encounter (HOSPITAL_COMMUNITY): Payer: 59

## 2011-07-27 ENCOUNTER — Other Ambulatory Visit: Payer: Self-pay | Admitting: *Deleted

## 2011-07-27 MED ORDER — ROSUVASTATIN CALCIUM 40 MG PO TABS: 40.0000 mg | ORAL_TABLET | Freq: Every day | ORAL | Status: DC

## 2011-07-27 MED ORDER — OXYCODONE-ACETAMINOPHEN 7.5-500 MG PO TABS: 1.0000 | ORAL_TABLET | Freq: Three times a day (TID) | ORAL | Status: DC | PRN

## 2011-07-27 NOTE — Telephone Encounter (Signed)
Bobby Pena came in and requested that we sent Rx to [facility].  Advised her that we have received refill request from [facility].  She stated that for this time only send Rx to [facility].

## 2011-07-27 NOTE — Telephone Encounter (Signed)
Please fill in Dr. Elmer Sow absence.  Looks like the Rx was printed but if printed from Dr. Elmer Sow home it will not print here in the office.  Please advise.

## 2011-07-27 NOTE — Telephone Encounter (Signed)
Patient advised.  Rx left at front desk for pick up. 

## 2011-07-27 NOTE — Telephone Encounter (Signed)
I called Dr. Dayton Martes.  Her plan was to fill this.  I will fill it based on her rec, in her absence.  She had not tried to print it, per patient.

## 2011-07-31 ENCOUNTER — Encounter (HOSPITAL_COMMUNITY): Payer: 59

## 2011-07-31 ENCOUNTER — Telehealth: Payer: Self-pay | Admitting: *Deleted

## 2011-07-31 MED ORDER — ROSUVASTATIN CALCIUM 40 MG PO TABS: 40.0000 mg | ORAL_TABLET | Freq: Every day | ORAL | Status: DC

## 2011-07-31 NOTE — Telephone Encounter (Signed)
Patients girlfriend [name] Gillum came in the office today stating that the Rx for Crestor 40mg  had not be received by [facility].  I advised her that the Rx was sent electronically on 07/27/2011 #90 with 3 refills.  She called the pharmacy from her cell phone and got Wilkie Aye on the phone and I gave a verbal authorization for Crestor 40mg  #90, 3 refills.  I apologized to [name] that we have been having some many issues with filling Mr. Ghosh Rx.  Wilkie Aye at [facility] stated that to her knowledge they have not had any issues with Rx's being sent electronically.

## 2011-08-02 ENCOUNTER — Encounter (HOSPITAL_COMMUNITY): Payer: 59

## 2011-08-03 ENCOUNTER — Encounter (HOSPITAL_COMMUNITY): Payer: 59

## 2011-08-07 ENCOUNTER — Encounter (HOSPITAL_COMMUNITY): Payer: 59

## 2011-08-09 ENCOUNTER — Other Ambulatory Visit: Payer: Self-pay | Admitting: *Deleted

## 2011-08-09 ENCOUNTER — Encounter (HOSPITAL_COMMUNITY): Payer: 59

## 2011-08-09 MED ORDER — CELECOXIB 200 MG PO CAPS: 200.0000 mg | ORAL_CAPSULE | Freq: Every day | ORAL | Status: DC

## 2011-08-09 MED ORDER — GEMFIBROZIL 600 MG PO TABS: 600.0000 mg | ORAL_TABLET | Freq: Two times a day (BID) | ORAL | Status: DC

## 2011-08-09 MED ORDER — AMLODIPINE BESYLATE 5 MG PO TABS: 5.0000 mg | ORAL_TABLET | Freq: Every day | ORAL | Status: DC

## 2011-08-09 MED ORDER — METOPROLOL TARTRATE 25 MG PO TABS: 25.0000 mg | ORAL_TABLET | Freq: Every day | ORAL | Status: DC

## 2011-08-09 MED ORDER — RANOLAZINE ER 500 MG PO TB12: 500.0000 mg | ORAL_TABLET | Freq: Two times a day (BID) | ORAL | Status: DC

## 2011-08-09 MED ORDER — FEXOFENADINE HCL 180 MG PO TABS: 180.0000 mg | ORAL_TABLET | Freq: Every day | ORAL | Status: DC

## 2011-08-09 MED ORDER — CLOPIDOGREL BISULFATE 75 MG PO TABS: 75.0000 mg | ORAL_TABLET | Freq: Every day | ORAL | Status: DC

## 2011-08-09 MED ORDER — ESCITALOPRAM OXALATE 20 MG PO TABS: 20.0000 mg | ORAL_TABLET | Freq: Every day | ORAL | Status: DC

## 2011-08-09 NOTE — Telephone Encounter (Signed)
Rx's were sent to [facility] mail order per patients request.  Rx's were sent to [facility] in the past but patient wants them sent to [facility] mail order from now on.

## 2011-08-10 ENCOUNTER — Encounter (HOSPITAL_COMMUNITY): Payer: 59

## 2011-08-14 ENCOUNTER — Encounter (HOSPITAL_COMMUNITY): Payer: 59

## 2011-08-16 ENCOUNTER — Encounter (HOSPITAL_COMMUNITY): Payer: 59

## 2011-08-17 ENCOUNTER — Encounter (HOSPITAL_COMMUNITY): Payer: 59

## 2011-08-20 ENCOUNTER — Telehealth: Payer: Self-pay | Admitting: *Deleted

## 2011-08-20 MED ORDER — ROSUVASTATIN CALCIUM 40 MG PO TABS: 20.0000 mg | ORAL_TABLET | Freq: Every day | ORAL | Status: DC

## 2011-08-20 NOTE — Telephone Encounter (Signed)
In my box. Advised contacting his cardiologist about continuing Plavix although I think he needs it since he has had a stent. Hold omeprazole for now.  Decrease Crestor to 20 mg daily, continue dose of gemfibrozil.

## 2011-08-20 NOTE — Telephone Encounter (Signed)
Left message on cell phone voicemail for patient to return call. 

## 2011-08-20 NOTE — Telephone Encounter (Signed)
Received fax from [facility] order regarding medication interactions.  Forms in your IN box.

## 2011-08-21 ENCOUNTER — Encounter: Payer: Self-pay | Admitting: Family Medicine

## 2011-08-21 ENCOUNTER — Encounter (HOSPITAL_COMMUNITY): Payer: 59

## 2011-08-21 NOTE — Telephone Encounter (Signed)
Spoke with [name] whom is Mr. Tweed' finance and she will have patient stop the Omeprazole and call Cardiology regarding Plavix.  She went through his medications and he is not taking Gemfibrozil.  I advised her that it was refilled with the last medications I refilled on 08/09/2011 to [facility] mail order.  She stated that they are not home now, they are still out of town checking on there home.  She asked me to call Roddie Mc, rep with [facility] regarding drug interactions.  I called and spoke with Damien Fusi was not available.  Advised of the dosage change of Crestor from 40mg  daily to 20mg  daily and gave verbal ok to fill Gemfibrozil.  Advised [name] that Gemfibrozil was on hold until [facility] heard from out office.  They will refill it and mail to patient.

## 2011-08-21 NOTE — Telephone Encounter (Signed)
Left message on cell phone voicemail for patient to return call. 

## 2011-08-21 NOTE — Progress Notes (Signed)
Spoke with [name] and went over patients medications.  She stated that patient is not taking Gemfibrozil and hasn't been taking it since 11/2010.  She stated that his Cardiologist took him off of Gemfibrozil and put him on Crestor.  [name] wanted to know that since he hasn't been on Gemfibrozil should the Crestor dose remain at 40mg .  Please advise.

## 2011-08-21 NOTE — Progress Notes (Signed)
Addended by: Gilmer Mor on: 08/21/2011 04:50 PM   Modules accepted: Orders

## 2011-08-21 NOTE — Progress Notes (Signed)
Yes please continue current dose of Crestor (40 mg) and make sure his medications are updated.  In future, cardiology should refill the medications they change and or prescribe so we do not make another error. Thanks!

## 2011-08-21 NOTE — Progress Notes (Signed)
Spoke with [name] and advised her as instructed to have Bobby Pena continue Crestor 40mg  daily.  Called [facility] and cancelled Rx for Gemfibrozil.

## 2011-08-22 ENCOUNTER — Telehealth: Payer: Self-pay | Admitting: *Deleted

## 2011-08-22 NOTE — Telephone Encounter (Signed)
Rx for Gemfibrozil was cancelled at [facility].

## 2011-08-22 NOTE — Telephone Encounter (Signed)
Patient called back to let Dr. Dayton Martes know that his Cardiologist told him when he had the stent placed that he would be on Plavix for the rest of his life.  Please advise.

## 2011-08-22 NOTE — Telephone Encounter (Signed)
Yes I assumed so.  I would like for cardiology to refill his cardiology medications so we don't make any errors.

## 2011-08-23 ENCOUNTER — Encounter (HOSPITAL_COMMUNITY): Payer: 59

## 2011-08-24 ENCOUNTER — Encounter (HOSPITAL_COMMUNITY): Payer: 59

## 2011-08-28 ENCOUNTER — Encounter (HOSPITAL_COMMUNITY): Payer: 59

## 2011-08-28 LAB — URINALYSIS, ROUTINE W REFLEX MICROSCOPIC
Glucose, UA: NEGATIVE
Protein, ur: NEGATIVE

## 2011-08-28 LAB — COMPREHENSIVE METABOLIC PANEL
ALT: 27
BUN: 13
CO2: 25
Calcium: 9.5
Creatinine, Ser: 1.04
GFR calc non Af Amer: >60
Glucose, Bld: 89

## 2011-08-28 LAB — CBC
HCT: 43.2
Hemoglobin: 14.5
MCHC: 33.6
MCV: 90.7
RBC: 4.76

## 2011-08-28 LAB — DIFFERENTIAL
Eosinophils Absolute: 0.1
Lymphocytes Relative: 21
Lymphs Abs: 2.1
Neutrophils Relative %: 70

## 2011-08-30 ENCOUNTER — Encounter (HOSPITAL_COMMUNITY): Payer: 59

## 2011-08-31 ENCOUNTER — Encounter (HOSPITAL_COMMUNITY): Payer: 59

## 2011-09-04 ENCOUNTER — Encounter (HOSPITAL_COMMUNITY): Payer: 59

## 2011-09-06 ENCOUNTER — Other Ambulatory Visit: Payer: Self-pay | Admitting: *Deleted

## 2011-09-06 ENCOUNTER — Encounter (HOSPITAL_COMMUNITY): Payer: 59

## 2011-09-06 MED ORDER — OXYCODONE-ACETAMINOPHEN 7.5-500 MG PO TABS: 1.0000 | ORAL_TABLET | Freq: Three times a day (TID) | ORAL | Status: DC | PRN

## 2011-09-06 NOTE — Telephone Encounter (Signed)
Patient called to request Rx refill.  He is given Brain Hilts permission to pick up the Rx for him.

## 2011-09-06 NOTE — Telephone Encounter (Signed)
Patient advised via telephone Rx ready for pick up will be left at front desk.

## 2011-09-07 ENCOUNTER — Encounter (HOSPITAL_COMMUNITY): Payer: 59

## 2011-09-11 ENCOUNTER — Encounter (HOSPITAL_COMMUNITY): Payer: 59

## 2011-09-13 ENCOUNTER — Encounter (HOSPITAL_COMMUNITY): Payer: 59

## 2011-09-14 ENCOUNTER — Encounter (HOSPITAL_COMMUNITY): Payer: 59

## 2011-09-18 ENCOUNTER — Encounter (HOSPITAL_COMMUNITY): Payer: 59

## 2011-09-20 ENCOUNTER — Encounter (HOSPITAL_COMMUNITY): Payer: 59

## 2011-09-21 ENCOUNTER — Encounter (HOSPITAL_COMMUNITY): Payer: 59

## 2011-09-25 ENCOUNTER — Encounter (HOSPITAL_COMMUNITY): Payer: 59

## 2011-09-27 ENCOUNTER — Encounter (HOSPITAL_COMMUNITY): Payer: 59

## 2011-09-28 ENCOUNTER — Encounter (HOSPITAL_COMMUNITY): Payer: 59

## 2011-10-02 ENCOUNTER — Encounter (HOSPITAL_COMMUNITY): Payer: 59

## 2011-10-04 ENCOUNTER — Encounter (HOSPITAL_COMMUNITY): Payer: 59

## 2011-10-05 ENCOUNTER — Other Ambulatory Visit: Payer: Self-pay | Admitting: Internal Medicine

## 2011-10-05 ENCOUNTER — Encounter (HOSPITAL_COMMUNITY): Payer: 59

## 2011-10-05 ENCOUNTER — Other Ambulatory Visit: Payer: Self-pay | Admitting: *Deleted

## 2011-10-05 MED ORDER — OXYCODONE-ACETAMINOPHEN 7.5-500 MG PO TABS: 1.0000 | ORAL_TABLET | Freq: Three times a day (TID) | ORAL | Status: DC | PRN

## 2011-10-05 NOTE — Telephone Encounter (Signed)
Advised patient via telephone that Rx for Oxycodone is ready for pick up will be left at front desk.

## 2011-10-09 ENCOUNTER — Encounter (HOSPITAL_COMMUNITY): Payer: 59

## 2011-10-11 ENCOUNTER — Encounter (HOSPITAL_COMMUNITY): Payer: 59

## 2011-10-12 ENCOUNTER — Encounter (HOSPITAL_COMMUNITY): Payer: 59

## 2011-10-16 ENCOUNTER — Encounter (HOSPITAL_COMMUNITY): Payer: 59

## 2011-10-18 ENCOUNTER — Encounter (HOSPITAL_COMMUNITY): Payer: 59

## 2011-10-19 ENCOUNTER — Encounter (HOSPITAL_COMMUNITY): Payer: 59

## 2011-10-23 ENCOUNTER — Encounter (HOSPITAL_COMMUNITY): Payer: 59

## 2011-10-25 ENCOUNTER — Encounter (HOSPITAL_COMMUNITY): Payer: 59

## 2011-10-26 ENCOUNTER — Encounter (HOSPITAL_COMMUNITY): Payer: 59

## 2011-10-29 ENCOUNTER — Telehealth: Payer: Self-pay | Admitting: *Deleted

## 2011-10-29 ENCOUNTER — Other Ambulatory Visit: Payer: Self-pay | Admitting: *Deleted

## 2011-10-29 MED ORDER — OXYCODONE-ACETAMINOPHEN 7.5-500 MG PO TABS: 1.0000 | ORAL_TABLET | Freq: Three times a day (TID) | ORAL | Status: DC | PRN

## 2011-10-29 NOTE — Telephone Encounter (Signed)
I'm sorry he is pain but these are controlled substances and as we discussed, cannot be filled at more than one pharmacy.

## 2011-10-29 NOTE — Telephone Encounter (Signed)
Patient advised as instructed via telephone.  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  He is working 12 hour days, 7 days a week and is on his feet constantly.  He wants to talk with Dr. Dayton Martes about increasing his pain medication dose because he is having to double up on his medication to take the pain away.  I advised patient that Dr. Dayton Martes is not willing to refill the medication early and that he is not allowed to refill this medication at multiple pharmacies.  30 minute appt scheduled for 11/02/2011 for patient to come in and discuss with you.

## 2011-10-29 NOTE — Telephone Encounter (Signed)
Patient called back and stated he is working 7 days a week and is having to double up on his percocet and wanted to know if you could Rx him a new prescription to [place] because he is working out of town.  Please advise.

## 2011-10-29 NOTE — Telephone Encounter (Signed)
Ms. Bobby Pena came by the office with a previous written rx for Percocet requesting that it be rewritten because she had taken this to her pharmacy and the pharmacist wrote on it that it was last filled 10/05/11 and can not be filled again until 11/11/11. Ms. Bobby Pena stating that she needs to mail this to the patient in [place] where he is working and will probably be there until Christmas. After speaking with Dr. Dayton Martes advised Ms. Bobby Pena that patient can only use one pharmacy to fill narcotics and by filling this medication at another pharmacy he will be breaking his narcotic contract with this office. Prescription was given back to her and she was advised to wait and get it filled when it is due at his current pharmacy.

## 2011-10-29 NOTE — Telephone Encounter (Signed)
[name] advised via telephone, Rx is ready for pick up will be left at front desk.

## 2011-10-29 NOTE — Telephone Encounter (Signed)
Last Rx dated 10/05/2011.

## 2011-10-30 ENCOUNTER — Encounter (HOSPITAL_COMMUNITY): Payer: 59

## 2011-11-01 ENCOUNTER — Encounter (HOSPITAL_COMMUNITY): Payer: 59

## 2011-11-02 ENCOUNTER — Encounter: Payer: Self-pay | Admitting: Family Medicine

## 2011-11-02 ENCOUNTER — Ambulatory Visit (INDEPENDENT_AMBULATORY_CARE_PROVIDER_SITE_OTHER): Payer: 59 | Admitting: Family Medicine

## 2011-11-02 ENCOUNTER — Encounter (HOSPITAL_COMMUNITY): Payer: 59

## 2011-11-02 VITALS — BP 140/70 | HR 74 | Temp 98.5°F | Ht 77.0 in | Wt 303.0 lb

## 2011-11-02 DIAGNOSIS — M25569 Pain in unspecified knee: Secondary | ICD-10-CM

## 2011-11-02 DIAGNOSIS — Z23 Encounter for immunization: Secondary | ICD-10-CM

## 2011-11-02 DIAGNOSIS — M549 Dorsalgia, unspecified: Secondary | ICD-10-CM

## 2011-11-02 MED ORDER — OXYCODONE HCL 5 MG PO TABS: ORAL_TABLET | ORAL | Status: DC

## 2011-11-02 NOTE — Progress Notes (Signed)
Addended by: Gilmer Mor on: 11/02/2011 02:02 PM   Modules accepted: Orders

## 2011-11-02 NOTE — Progress Notes (Signed)
55 year old here to discuss chronic back pain.  Saw Dr. Patsy Lager for left knee pain, wearing a brace which has not been helping much.  Has a history of left arthroscopy, knee, probable partial meniscectomy and probable loose body removal 3 years ago.  Also has h/o chronic low back pain, multiple disc issues in lumbar region, declined surgery in past. Referred to pain clinic but he preferred to come here and did no show any indications of abuse at that time.  On Percocet 7.5-325 -1 tab every eight hours but he has been doubling dose on his own and wants me to increase his pain medication. 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 Wants to go ahead with back surgery after this project is over in several months.  He has tried to fill it at different pharmacies since he travels, although we have explained that is prohibited (pain contract states this which he did sign).  On controlled substances tracking site, no violates or red flags.  BP elevated.  Has not taken any medications this morning.  The PMH, PSH, Social History, Family History, Medications, and allergies have been reviewed in [facility], and have been updated if relevant.  REVIEW OF SYSTEMS  GEN: No fevers, chills MSK: Detailed in the HPI GI: tolerating PO intake without difficulty Neuro: No numbness, parasthesias, or tingling associated. Otherwise the pertinent positives of the ROS are noted above.   PHYSICAL EXAM: BP 140/70  Pulse 74  Temp(Src) 98.5 F (36.9 C) (Oral)  Ht 6\' 5"  (1.956 m)  Wt 303 lb (137.44 kg)  BMI 35.93 kg/m2  GEN: Well-developed,well-nourished,in no acute distress; alert,appropriate and cooperative throughout examination HEENT: Normocephalic and atraumatic without obvious abnormalities. Ears, externally no deformities PULM: Breathing comfortably in no respiratory distress EXT: No clubbing, cyanosis, or edema PSYCH: Normally interactive. Cooperative  during the interview. Pleasant. Friendly and conversant. Not anxious or depressed appearing. Normal, full affect. MSK:  Left knee in brace- did not examen today  SLR pos right, neg left.  Walks with limp. DTS equal.  Assessment and Plan: 1. BACK PAIN   Deteriorated. >25 min spent with face to face with patient, >50% counseling and/or coordinating care. Will d/c percocet out of concern of Tylenol overdose. Will give rx for oxycodone--advised absolutely NOT operating machinery while taking it. He says that he is not. Reviewed pain contract again.

## 2011-11-02 NOTE — Patient Instructions (Signed)
Good to see you. Let's change your medication to oxycodone (without tylenol). You can take Tylenol as needed along with this medication. Have a Happy Holiday.

## 2011-11-06 ENCOUNTER — Encounter (HOSPITAL_COMMUNITY): Payer: 59

## 2011-11-08 ENCOUNTER — Encounter (HOSPITAL_COMMUNITY): Payer: 59

## 2011-11-09 ENCOUNTER — Encounter (HOSPITAL_COMMUNITY): Payer: 59

## 2011-11-13 ENCOUNTER — Encounter (HOSPITAL_COMMUNITY): Payer: 59

## 2011-11-15 ENCOUNTER — Encounter (HOSPITAL_COMMUNITY): Payer: 59

## 2011-11-16 ENCOUNTER — Encounter (HOSPITAL_COMMUNITY): Payer: 59

## 2011-11-20 ENCOUNTER — Encounter (HOSPITAL_COMMUNITY): Payer: 59

## 2011-11-21 ENCOUNTER — Other Ambulatory Visit: Payer: Self-pay | Admitting: Internal Medicine

## 2011-11-21 NOTE — Telephone Encounter (Signed)
Request a refill on Plavix and Lexapro sent to [facility] Mail order.  Please call patient when ordered.

## 2011-11-22 ENCOUNTER — Other Ambulatory Visit: Payer: Self-pay | Admitting: Family Medicine

## 2011-11-22 ENCOUNTER — Encounter (HOSPITAL_COMMUNITY): Payer: 59

## 2011-11-22 MED ORDER — ESCITALOPRAM OXALATE 20 MG PO TABS: 20.0000 mg | ORAL_TABLET | Freq: Every day | ORAL | Status: DC

## 2011-11-22 NOTE — Telephone Encounter (Signed)
Ok to refill his lexapro but cardiology should be refilling his plavix.

## 2011-11-22 NOTE — Telephone Encounter (Signed)
Left message on cell phone voicemail advising patient that Lexapro was sent to [facility] mail order pharmacy but he needs to get Plavix Rx from his Cardiologist which was discussed with patient a few months ago.

## 2011-11-23 ENCOUNTER — Encounter (HOSPITAL_COMMUNITY): Payer: 59

## 2011-11-23 NOTE — Telephone Encounter (Signed)
Rx denied, patient should contact his Cardiologist for further refills on Plavix.  Patient advised and pharmacy notified.

## 2011-11-27 ENCOUNTER — Encounter (HOSPITAL_COMMUNITY): Payer: 59

## 2011-11-29 ENCOUNTER — Encounter (HOSPITAL_COMMUNITY): Payer: 59

## 2011-11-29 ENCOUNTER — Other Ambulatory Visit: Payer: Self-pay | Admitting: Internal Medicine

## 2011-11-29 MED ORDER — OXYCODONE HCL 5 MG PO TABS: ORAL_TABLET | ORAL | Status: DC

## 2011-11-29 NOTE — Telephone Encounter (Signed)
Patient states he is leaving to go out of town Sunday and will not get a chance to refill his pain med wanted to know if we could go ahead and send it in.

## 2011-11-29 NOTE — Telephone Encounter (Signed)
Patient advised via telephone, Rx ready for pick up will be left at front desk. 

## 2011-11-30 ENCOUNTER — Encounter (HOSPITAL_COMMUNITY): Payer: 59

## 2011-12-04 ENCOUNTER — Encounter (HOSPITAL_COMMUNITY): Payer: 59

## 2011-12-06 ENCOUNTER — Encounter (HOSPITAL_COMMUNITY): Payer: 59

## 2011-12-06 ENCOUNTER — Other Ambulatory Visit: Payer: Self-pay | Admitting: Cardiovascular Disease

## 2011-12-06 MED ORDER — CLOPIDOGREL BISULFATE 75 MG PO TABS: 75.0000 mg | ORAL_TABLET | Freq: Every day | ORAL | Status: DC

## 2011-12-06 NOTE — Telephone Encounter (Signed)
New Msg: Pt domestic partner calling wanting to know if MD wants pt to continue taking plavix. Please return pt call to discuss further.

## 2011-12-06 NOTE — Telephone Encounter (Signed)
Spoke to [employer] regarding pts Plavix. He is out of his medication and needs a refill. The pt is currently working out of town and needs refill sent to another pharmacy. Pharmacy info obtained. Refill submitted.

## 2011-12-07 ENCOUNTER — Encounter (HOSPITAL_COMMUNITY): Payer: 59

## 2011-12-11 ENCOUNTER — Encounter (HOSPITAL_COMMUNITY): Payer: 59

## 2011-12-13 ENCOUNTER — Encounter (HOSPITAL_COMMUNITY): Payer: 59

## 2011-12-14 ENCOUNTER — Encounter (HOSPITAL_COMMUNITY): Payer: 59

## 2011-12-18 ENCOUNTER — Encounter (HOSPITAL_COMMUNITY): Payer: 59

## 2011-12-20 ENCOUNTER — Encounter (HOSPITAL_COMMUNITY): Payer: 59

## 2011-12-21 ENCOUNTER — Encounter (HOSPITAL_COMMUNITY): Payer: 59

## 2011-12-25 ENCOUNTER — Encounter (HOSPITAL_COMMUNITY): Payer: 59

## 2011-12-27 ENCOUNTER — Other Ambulatory Visit: Payer: Self-pay | Admitting: Family Medicine

## 2011-12-27 ENCOUNTER — Encounter (HOSPITAL_COMMUNITY): Payer: 59

## 2011-12-27 NOTE — Telephone Encounter (Signed)
Requesting pain medication refill

## 2011-12-28 ENCOUNTER — Encounter (HOSPITAL_COMMUNITY): Payer: 59

## 2011-12-28 ENCOUNTER — Encounter: Payer: Self-pay | Admitting: Family Medicine

## 2011-12-28 ENCOUNTER — Ambulatory Visit (INDEPENDENT_AMBULATORY_CARE_PROVIDER_SITE_OTHER): Payer: PRIVATE HEALTH INSURANCE | Admitting: Family Medicine

## 2011-12-28 DIAGNOSIS — R05 Cough: Secondary | ICD-10-CM

## 2011-12-28 MED ORDER — AZITHROMYCIN 250 MG PO TABS: ORAL_TABLET | ORAL | Status: DC

## 2011-12-28 MED ORDER — OXYCODONE HCL 5 MG PO TABS: ORAL_TABLET | ORAL | Status: DC

## 2011-12-28 NOTE — Telephone Encounter (Signed)
Rx left at front desk for patient to pick up.  Patient is waiting in the lobby for Rx.

## 2011-12-28 NOTE — Progress Notes (Signed)
duration of symptoms: ~1 month.  He thought he was getting better but then declined again. Mult sick contacts. Rhinorrhea:yes, clear congestion:yes ear pain: no sore throat:no Cough:yes, chest congestion, clear sputum Myalgias: yes other concerns:no fevers.  Mild inc in dyspnea in early AM due to cough and congestion, but then isn't sob during the day.  He noted wheeze.    He's working to lose weight. Smoking 1.5 PPD, was at 3PPD.  2-3 highballs in the evening.    ROS: See HPI.  Otherwise negative.    Meds, vitals, and allergies reviewed.   GEN: nad, alert and oriented HEENT: mucous membranes moist, TM w/o erythema, nasal epithelium injected, OP with cobblestoning NECK: supple w/o LA CV: rrr. PULM: B ronchi, no inc wob ABD: soft, +bs EXT: no edema

## 2011-12-28 NOTE — Patient Instructions (Signed)
Start the antibiotics now and try to get some rest.  Drink plenty of fluids.  Take care.

## 2011-12-30 ENCOUNTER — Encounter: Payer: Self-pay | Admitting: Family Medicine

## 2011-12-30 NOTE — Assessment & Plan Note (Signed)
Start zmax given the duration and exam.  No steroids given the absence of wheeze.  D/wpt about smoking.  He's working on this.  Okay for outpatient f/u.

## 2012-01-01 ENCOUNTER — Encounter (HOSPITAL_COMMUNITY): Payer: 59

## 2012-01-02 ENCOUNTER — Telehealth: Payer: Self-pay | Admitting: Family Medicine

## 2012-01-02 MED ORDER — AZITHROMYCIN 250 MG PO TABS: ORAL_TABLET | ORAL | Status: AC

## 2012-01-02 NOTE — Telephone Encounter (Signed)
Date: 01/02/2012 12:00:00 AM Time of Call: [phone] Faxed To: -[facility] (Daytime Triage) Caller: [name] Fax Number: [phone] Facility: N/A Patient: Bobby Pena, Bobby Pena DOB: [birth year] Phone: [phone] Provider: Ruthe Mannan Nestor Ramp) Message: Wife states that her husband is working in [employer] [facility] and he finished his zpac and isnt completely well and needs to get a refill on it for him Please call to advise. Regarding Appointment: Appt Date: Appt Time: Unknown Provider: Reason: Details: Outcome: Message Taken by: Thomasena Edis, CSR FAX Call-A

## 2012-01-02 NOTE — Telephone Encounter (Signed)
Rx sent to [facility] pharmacy, left message on cell phone voicemail advising [name] as instructed.

## 2012-01-02 NOTE — Telephone Encounter (Signed)
Ok to refill once, no further meds without being seen.

## 2012-01-03 ENCOUNTER — Encounter (HOSPITAL_COMMUNITY): Payer: 59

## 2012-01-04 ENCOUNTER — Encounter (HOSPITAL_COMMUNITY): Payer: 59

## 2012-01-08 ENCOUNTER — Encounter (HOSPITAL_COMMUNITY): Payer: 59

## 2012-01-10 ENCOUNTER — Encounter (HOSPITAL_COMMUNITY): Payer: 59

## 2012-01-18 ENCOUNTER — Telehealth: Payer: Self-pay | Admitting: Family Medicine

## 2012-01-18 ENCOUNTER — Encounter: Payer: Self-pay | Admitting: Family Medicine

## 2012-01-18 ENCOUNTER — Ambulatory Visit (INDEPENDENT_AMBULATORY_CARE_PROVIDER_SITE_OTHER): Payer: PRIVATE HEALTH INSURANCE | Admitting: Family Medicine

## 2012-01-18 VITALS — BP 122/78 | HR 66 | Temp 98.0°F | Wt 300.0 lb

## 2012-01-18 DIAGNOSIS — M1710 Unilateral primary osteoarthritis, unspecified knee: Secondary | ICD-10-CM

## 2012-01-18 MED ORDER — OXYCODONE HCL 5 MG PO TABS: ORAL_TABLET | ORAL | Status: DC

## 2012-01-18 NOTE — Progress Notes (Signed)
  Patient Name: Bobby Pena Date of Birth: Mar 11, 1956 Age: 56 y.o. Medical Record Number: [id number] Gender: male Date of Encounter: 01/18/2012  History of Present Illness:  Bobby Pena is a 56 y.o. very pleasant male patient who presents with the following:  Pleasant gentleman who him ever well than osteoarthritis in bilateral knees. He is working hard work site right now, is having some flare of his right knee greater than his left knee. He is tried to additional care piece in Tylenol, anti-inflammatories, as well as his chronic pain medicine, oxycodone. He is still having some significant pain and a mild effusion in his right knee. He is walking around in the mud much of the time right now.  Past Medical History, Surgical History, Social History, Family History, Problem List, Medications, and Allergies have been reviewed and updated if relevant.  Review of Systems:  GEN: No fevers, chills. Nontoxic. Primarily MSK c/o today. MSK: Detailed in the HPI GI: tolerating PO intake without difficulty Neuro: No numbness, parasthesias, or tingling associated. Otherwise the pertinent positives of the ROS are noted above.    Physical Examination: Filed Vitals:   01/18/12 1124  BP: 122/78  Pulse: 66  Temp: 98 F (36.7 C)  TempSrc: Oral  Weight: 300 lb (136.079 kg)  SpO2: 98%    There is no height on file to calculate BMI.   GEN: WDWN, NAD, Non-toxic, Alert & Oriented x 3 HEENT: Atraumatic, Normocephalic.  Ears and Nose: No external deformity. EXTR: No clubbing/cyanosis/edema NEURO: Normal gait.  PSYCH: Normally interactive. Conversant. Not depressed or anxious appearing.  Calm demeanor.   Knee:  R Gait: Normal heel toe pattern ROM: 0-120 Effusion: mild Echymosis or edema: none Patellar tendon NT Painful PLICA: neg Patellar grind: negative Medial and lateral patellar facet loading: negative medial and lateral joint lines:mild medial and lateral joint line  pain Mcmurray's neg Flexion-pinch neg Varus and valgus stress: stable Lachman: neg Ant and Post drawer: neg Hip abduction, IR, ER: WNL Hip flexion str: 5/5 Hip abd: 5/5 Quad: 5/5 VMO atrophy:No Hamstring concentric and eccentric: 5/5   Assessment and Plan:  1. Osteoarthritis, knee     Arthritic flare, right knee. Continue with conservative management, refilled the patient's oxycodone.  Knee Injection, RIGHT Patient verbally consented to procedure. Risks (including potential rare risk of infection), benefits, and alternatives explained. Sterilely prepped with Chloraprep. Ethyl cholride used for anesthesia. 9 cc Lidocaine 1% mixed with 1 cc of Depo-Medrol 40 mg injected using the anterolateral approach without difficulty. No complications with procedure and tolerated well. Patient had decreased pain post-injection.

## 2012-01-18 NOTE — Telephone Encounter (Signed)
This has been taken care off.

## 2012-01-18 NOTE — Telephone Encounter (Signed)
Triage Record Num: [id number] Operator: Chevis Pretty Patient Name: Bobby Pena Call Date & Time: 01/18/2012 9:56:56AM Patient Phone: [phone] PCP: Ruthe Mannan Patient Gender: Male PCP Fax : [phone] Patient DOB: 03-Jan-1956 Practice Name: [facility] Reason for Call: Caller: Bobby Pena/Patient; PCP: Ruthe Mannan [facility]); CB#: [id number]; ; ; Call regarding Knee Pain; Would Like Appt To Get Cortisone Shot for Pain in Knees; states history of arthritis in back, knees for years. States most recently had injections in knees a year ago in [place]; takes pain medication for his back as well. R knee pain is worse than L . Creates limp. Works out of town. Takes oxycodone 5mg  and will need refill for that before he leaves 01/20/12. No appt slots available in Epic for acute appt; info to office for staff callback/workin. PLEASE CONTACT PATIENT FOR WORKIN APPT. MAY REACH PATIENT AT [phone]. Protocol(s) Used: Knee Non-Injury Recommended Outcome per Protocol: See Provider within 24 hours Reason for Outcome: New onset mild to moderate pain that has not improved with 24 hours of home care Care Advice: ~ Call provider if symptoms worsen or new symptoms develop. Limit weight-bearing activity until evaluated by provider. Avoid movements or exercises that aggravate symptoms, such as jogging, stair-climbing, prolonged standing, etc. ~ Apply cloth-covered ice pack or a cool compress to the area for no more than 20 minutes 4-8 times a day while awake to reduce pain and swelling. ~ Avoid standing or sitting with legs dependent for more than 1-2 hours at a time. Change positions and move extremities every hour. Do not cross legs. Avoid tight or restricting clothing. ~ ~ SYMPTOM / CONDITION MANAGEMENT ~ CAUTIONS Analgesic/Antipyretic Advice - Acetaminophen: Consider acetaminophen as directed on label or by pharmacist/provider for pain or fever PRECAUTIONS: - Use if there is  no history of liver disease, alcoholism, or intake of three or more alcohol drinks per day - Only if approved by provider during pregnancy or when breastfeeding - During pregnancy, acetaminophen should not be taken more than 3 consecutive days without telling provider - Do not exceed recommended dose or frequency ~ Analgesic/Antipyretic Advice - NSAIDs: Consider aspirin, ibuprofen, naproxen or ketoprofen for pain or fever as directed on label or by pharmacist/provider. PRECAUTIONS: - If over 21 years of age, should not take longer than 1 week without consulting provider. EXCEPTIONS: - Should not be used if taking blood thinners or have bleeding problems. - Do not use if have history of sensitivity/allergy to any of these medications; or history of cardiovascular, ulcer, kidney, liver disease or diabetes unless approved by provider. - Do not exceed recommended dose or frequency. ~ 02

## 2012-01-18 NOTE — Telephone Encounter (Signed)
I'm just getting this message now.  There are no slots available for appt today.  May go to [facility]. Looks like had #180 oxycodone IR on 01/18/2012 by Dr. Salena Saner?  Has this already been taken care of???

## 2012-02-19 ENCOUNTER — Other Ambulatory Visit: Payer: Self-pay

## 2012-02-19 MED ORDER — OXYCODONE HCL 5 MG PO TABS: ORAL_TABLET | ORAL | Status: DC

## 2012-02-19 NOTE — Telephone Encounter (Signed)
Pt came by and requested written rx for Oxycodone 5 mg. Pt last seen 01/18/12. Pt can be reached at [phone] when ready for pick up.

## 2012-02-20 NOTE — Telephone Encounter (Signed)
Script placed up front for pick up, pt is aware.

## 2012-03-11 ENCOUNTER — Other Ambulatory Visit: Payer: Self-pay | Admitting: Family Medicine

## 2012-03-11 ENCOUNTER — Other Ambulatory Visit: Payer: Self-pay

## 2012-03-11 MED ORDER — CELECOXIB 200 MG PO CAPS: ORAL_CAPSULE | ORAL | Status: DC

## 2012-03-11 MED ORDER — MONTELUKAST SODIUM 10 MG PO TABS: 10.0000 mg | ORAL_TABLET | Freq: Every day | ORAL | Status: DC

## 2012-03-11 NOTE — Telephone Encounter (Signed)
Bobby Pena said insurance co has a rule if pt has not received a refill on prescripton within 6 months pt can not get a 90 day supply from mail order pharmacy until gets a 30 days supply from a local pharmacy. Bobby Pena said pt has been taking his medicine but had "a stash" built up and has not ordered meds recently. Pt needs 30 day prescription sent to [facility] for Celebrex 200 mg and Montelukast 10 mg. Bobby Pena can be reached at [phone] and pt last seen 01/18/12.Please advise.

## 2012-03-11 NOTE — Telephone Encounter (Signed)
Bobby Pena notified med sent to [facility] pharmacy.

## 2012-03-18 ENCOUNTER — Other Ambulatory Visit: Payer: Self-pay

## 2012-03-18 MED ORDER — OXYCODONE HCL 5 MG PO TABS: ORAL_TABLET | ORAL | Status: DC

## 2012-03-18 NOTE — Telephone Encounter (Signed)
Pt's family left v/m requesting written rx for Oxycodone 5 mg. Pt last seen 01/18/12 by Dr Patsy Lager. please call [phone] when rx is ready for pick up. Pt is going out of town 03/20/12.

## 2012-03-18 NOTE — Telephone Encounter (Signed)
Left message advising Bobby Pena that script is ready for pick up, script placed up front.

## 2012-03-19 ENCOUNTER — Telehealth: Payer: Self-pay | Admitting: *Deleted

## 2012-03-19 NOTE — Telephone Encounter (Signed)
Prior Berkley Harvey is needed for oxycodone, form is on your desk.

## 2012-03-20 NOTE — Telephone Encounter (Signed)
Form faxed

## 2012-03-20 NOTE — Telephone Encounter (Signed)
Prior auth given for oxycodone, letter placed on doctor's desk for signature and scanning.

## 2012-03-20 NOTE — Telephone Encounter (Signed)
In my box

## 2012-04-17 ENCOUNTER — Other Ambulatory Visit: Payer: Self-pay

## 2012-04-17 MED ORDER — FENOFIBRATE 145 MG PO TABS: 145.0000 mg | ORAL_TABLET | Freq: Every day | ORAL | Status: DC

## 2012-04-17 MED ORDER — MONTELUKAST SODIUM 10 MG PO TABS: 10.0000 mg | ORAL_TABLET | Freq: Every day | ORAL | Status: DC

## 2012-04-17 MED ORDER — OXYCODONE HCL 5 MG PO TABS: ORAL_TABLET | ORAL | Status: DC

## 2012-04-17 MED ORDER — CELECOXIB 200 MG PO CAPS: ORAL_CAPSULE | ORAL | Status: DC

## 2012-04-17 NOTE — Telephone Encounter (Signed)
V/M pt in [place]  left multiple requests; Celebrex and Montelukast for 90 day supply to go to [facility] to [facility] fax# [phone] and need written rx for Oxycodone 5mg . Call when ready for pick up.(will have someone in [place] pick up).Please advise.

## 2012-04-17 NOTE — Telephone Encounter (Signed)
Script for tricor faxed to [facility] at number provided in note below, left message advising candace gillum that oxycodone script is ready for pick up.

## 2012-05-08 ENCOUNTER — Other Ambulatory Visit: Payer: Self-pay

## 2012-05-08 NOTE — Telephone Encounter (Signed)
Bobby Pena with [facility] mail order request refill Singulair for 90 days. Singulair filled # [phone] on 04/17/12 [facility].

## 2012-05-12 ENCOUNTER — Other Ambulatory Visit: Payer: Self-pay | Admitting: *Deleted

## 2012-05-12 NOTE — Telephone Encounter (Signed)
Ok to refill 90 day supplies.  Needs to be seen for further refills.

## 2012-05-12 NOTE — Telephone Encounter (Signed)
OK to refill for 90 day supply? No upcoming appt and no recent labs.

## 2012-05-13 MED ORDER — ROSUVASTATIN CALCIUM 40 MG PO TABS: 40.0000 mg | ORAL_TABLET | Freq: Every day | ORAL | Status: DC

## 2012-05-13 MED ORDER — MONTELUKAST SODIUM 10 MG PO TABS: 10.0000 mg | ORAL_TABLET | Freq: Every day | ORAL | Status: DC

## 2012-05-13 MED ORDER — FENOFIBRATE 145 MG PO TABS: 145.0000 mg | ORAL_TABLET | Freq: Every day | ORAL | Status: DC

## 2012-05-16 ENCOUNTER — Other Ambulatory Visit: Payer: Self-pay

## 2012-05-16 MED ORDER — OXYCODONE HCL 5 MG PO TABS: ORAL_TABLET | ORAL | Status: DC

## 2012-05-16 NOTE — Telephone Encounter (Signed)
Advised [name] script is ready for pick up.

## 2012-05-16 NOTE — Addendum Note (Signed)
Addended by: Eliezer Bottom on: 05/16/2012 10:23 AM   Modules accepted: Orders

## 2012-05-16 NOTE — Telephone Encounter (Signed)
Script on doctors desk for signature.

## 2012-05-16 NOTE — Telephone Encounter (Signed)
Candice request rx Oxycodone. call when ready for pick up.

## 2012-06-01 IMAGING — CR DG CHEST 2V
2 series · 2 of 2 positions shown · non-contrast
Comparison: Chest 09/17/2008.

CLINICAL DATA: Cough and hypertension.

CHEST - 2 VIEW

[view not recorded (1 of 2)]
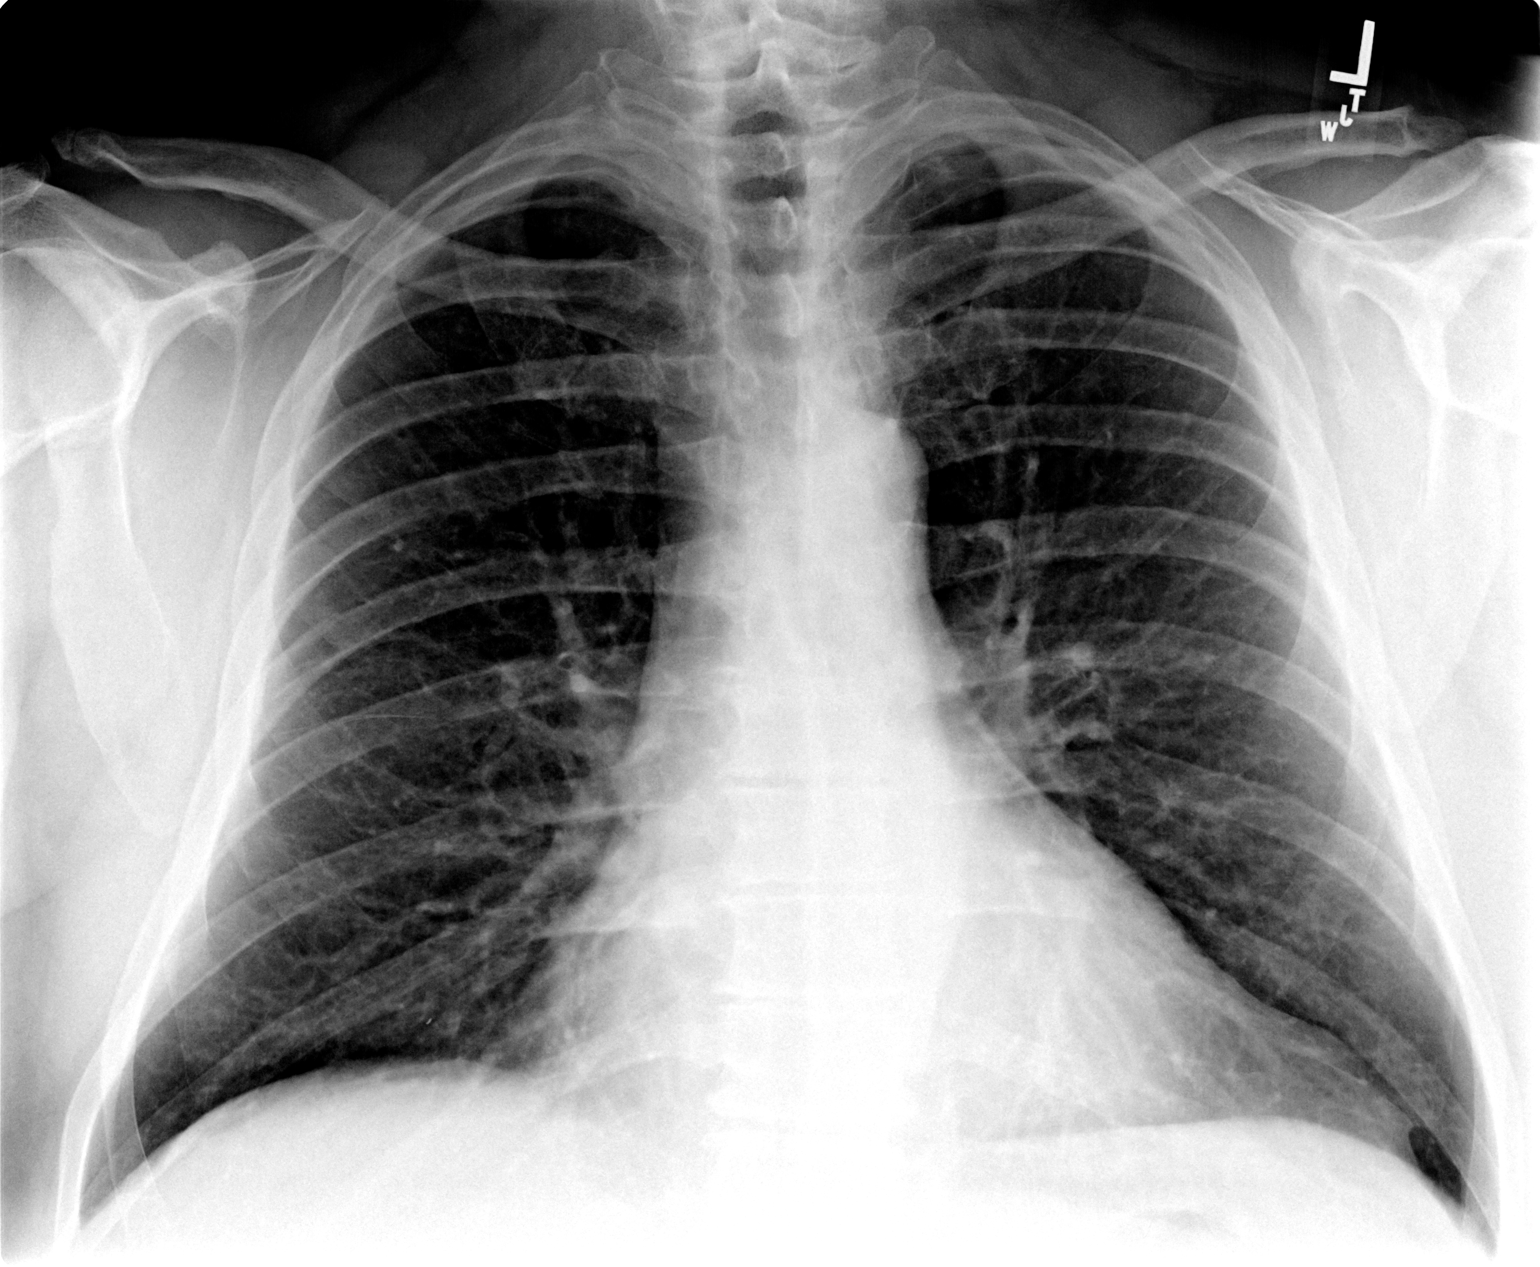

[view not recorded (2 of 2)]
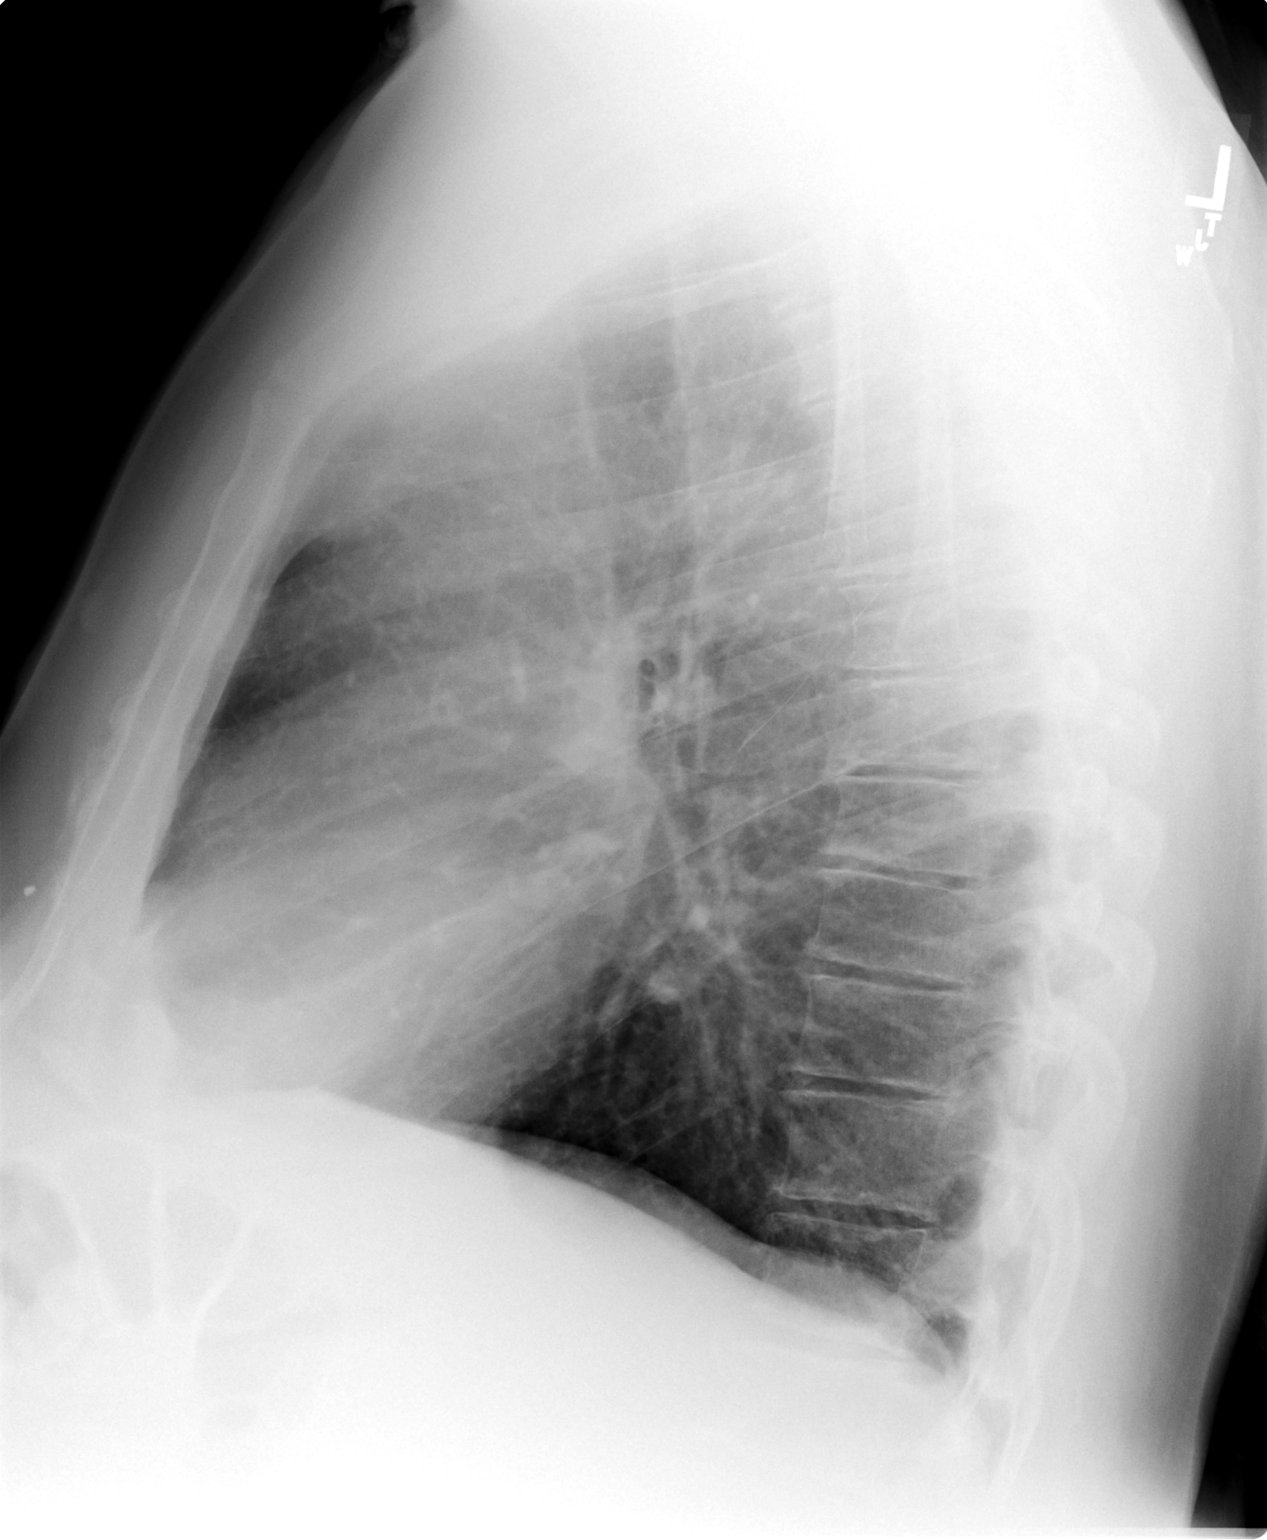

[2 of 2 positions shown; findings below may reference images not displayed]

FINDINGS: Lungs are clear.  Heart size is upper normal.  No pleural
effusion or pneumothorax.  Postoperative change right shoulder
noted.
IMPRESSION: No acute finding.

## 2012-06-12 ENCOUNTER — Other Ambulatory Visit: Payer: Self-pay

## 2012-06-12 MED ORDER — OXYCODONE HCL 5 MG PO TABS: ORAL_TABLET | ORAL | Status: DC

## 2012-06-12 NOTE — Telephone Encounter (Signed)
Pts wife request rx oxycodone. Call when ready for pick up.

## 2012-06-13 NOTE — Telephone Encounter (Signed)
Advised [name] script is ready for pick up, script placed up front.

## 2012-07-11 ENCOUNTER — Other Ambulatory Visit: Payer: Self-pay

## 2012-07-11 MED ORDER — OXYCODONE HCL 5 MG PO TABS: ORAL_TABLET | ORAL | Status: DC

## 2012-07-11 NOTE — Telephone Encounter (Signed)
Advised Bobby Pena that script is ready for pick up.

## 2012-07-11 NOTE — Telephone Encounter (Signed)
Canduce request rx Oxycodone. Call when ready for pickup.

## 2012-08-06 ENCOUNTER — Encounter: Payer: Self-pay | Admitting: Family Medicine

## 2012-08-06 ENCOUNTER — Ambulatory Visit (INDEPENDENT_AMBULATORY_CARE_PROVIDER_SITE_OTHER): Payer: PRIVATE HEALTH INSURANCE | Admitting: Family Medicine

## 2012-08-06 VITALS — BP 148/80 | HR 76 | Temp 97.9°F | Wt 304.0 lb

## 2012-08-06 DIAGNOSIS — M5137 Other intervertebral disc degeneration, lumbosacral region: Secondary | ICD-10-CM

## 2012-08-06 DIAGNOSIS — E785 Hyperlipidemia, unspecified: Secondary | ICD-10-CM

## 2012-08-06 DIAGNOSIS — M1710 Unilateral primary osteoarthritis, unspecified knee: Secondary | ICD-10-CM

## 2012-08-06 DIAGNOSIS — I10 Essential (primary) hypertension: Secondary | ICD-10-CM

## 2012-08-06 MED ORDER — OXYCODONE HCL 5 MG PO TABS: ORAL_TABLET | ORAL | Status: DC

## 2012-08-06 NOTE — Progress Notes (Signed)
56 yo here for follow up chronic medical issues.   HLD- On zocor 40 mg daily.  Denies any myalgias.  Lab Results  Component Value Date   CHOL 185 06/11/2011   HDL 37.40* 06/11/2011   LDLDIRECT 118.2 06/11/2011   TRIG 276.0* 06/11/2011   CHOLHDL 5 06/11/2011     HTN- On amlodipine and lopressor.  Denies any HA, blurred vision or SOB. No CP.   Chronic knee and back pain- taking oxycodone 5- 10 mg every 8 hours.  Has been working more lately, needing to take oxycodone daily after working.  Patient Active Problem List  Diagnosis  . NEVUS  . OTHER TESTICULAR HYPOFUNCTION  . OTHER SPECIFIED ENDOCRINE DISORDERS  . HYPERLIPIDEMIA  . TOBACCO ABUSE  . DEPRESSION  . OBSTRUCTIVE SLEEP APNEA  . HYPERTENSION  . MYOCARDIAL INFARCTION, HX OF  . SLEEP APNEA  . SHORTNESS OF BREATH  . COUGH  . OTHER URINARY INCONTINENCE  . COLONIC POLYPS, HX OF  . CAD  . DEGENERATIVE DISC DISEASE, LUMBAR SPINE  . BACK PAIN  . OSTEOARTHRITIS, KNEE, LEFT, MILD  . KNEE PAIN, LEFT  . GERD  . FATIGUE   Past Medical History  Diagnosis Date  . CAD (coronary artery disease)      a. h/o stent to the OM;  b. cath in 2004: OM stent patent, no obstructive CAD, EF 60%;    c.  h/o cath in [place];       d.  myoview 07/2010: EF 55%; inf. scar vs. artifact, no ischemia; low risk study  . Hypertension   . Hyperlipidemia   . COPD (chronic obstructive pulmonary disease)   . GERD (gastroesophageal reflux disease)   . Obstructive sleep apnea   . Depression   . Colon polyps   . Degenerative disc disease, lumbar   . Degenerative joint disease     Left knee   No past surgical history on file. History  Substance Use Topics  . Smoking status: Current Every Day Smoker -- 1.0 packs/day for 38 years  . Smokeless tobacco: Not on file  . Alcohol Use: Yes     weekly, Vodka   Family History  Problem Relation Age of Onset  . Lung cancer Mother   . Heart attack Maternal Uncle    Allergies  Allergen Reactions    . Varenicline Tartrate    Current Outpatient Prescriptions on File Prior to Visit  Medication Sig Dispense Refill  . amLODipine (NORVASC) 5 MG tablet Take 1 tablet (5 mg total) by mouth daily.  90 tablet  3  . aspirin 325 MG tablet Take 325 mg by mouth daily.        Marland Kitchen buPROPion (ZYBAN) 150 MG 12 hr tablet Take 150 mg by mouth 2 (two) times daily.       . celecoxib (CELEBREX) 200 MG capsule TAKE ONE CAPSULE BY MOUTH DAILY  90 capsule  0  . clopidogrel (PLAVIX) 75 MG tablet Take 1 tablet (75 mg total) by mouth daily.  90 tablet  3  . escitalopram (LEXAPRO) 20 MG tablet Take 1 tablet (20 mg total) by mouth daily.  90 tablet  3  . fenofibrate (TRICOR) 145 MG tablet Take 1 tablet (145 mg total) by mouth daily.  90 tablet  0  . fexofenadine (ALLEGRA) 180 MG tablet Take 1 tablet (180 mg total) by mouth daily.  90 tablet  3  . metoprolol tartrate (LOPRESSOR) 25 MG tablet Take 1 tablet (25 mg total) by mouth  daily.  90 tablet  3  . montelukast (SINGULAIR) 10 MG tablet Take 1 tablet (10 mg total) by mouth at bedtime.  90 tablet  0  . oxyCODONE (OXY IR/ROXICODONE) 5 MG immediate release tablet 1-2 tablets every 8 hours as needed for pain  180 tablet  0  . ranolazine (RANEXA) 500 MG 12 hr tablet Take 1 tablet (500 mg total) by mouth 2 (two) times daily.  180 tablet  3  . rosuvastatin (CRESTOR) 40 MG tablet Take 1 tablet (40 mg total) by mouth daily.  90 tablet  0     The PMH, PSH, Social History, Family History, Medications, and allergies have been reviewed in [facility], and have been updated if relevant.  REVIEW OF SYSTEMS See HPI  PHYSICAL EXAM: BP 148/80  Pulse 76  Temp 97.9 F (36.6 C)  Wt 304 lb (137.893 kg) General:  overweght male in NAD Eyes:  PERRL Ears:  External ear exam shows no significant lesions or deformities.  Otoscopic examination reveals clear canals, tympanic membranes are intact bilaterally without bulging, retraction, inflammation or discharge. Hearing is grossly normal  bilaterally. Nose:  External nasal examination shows no deformity or inflammation. Nasal mucosa are pink and moist without lesions or exudates. Mouth:  Oral mucosa and oropharynx without lesions or exudates.  Teeth in good repair. Neck:  no carotid bruit or thyromegaly no cervical or supraclavicular lymphadenopathy  Lungs:  Normal respiratory effort, chest expands symmetrically. Lungs are clear to auscultation, no crackles or wheezes. Heart:  Normal rate and regular rhythm. S1 and S2 normal without gallop, murmur, click, rub or other extra sounds. Abdomen:  Bowel sounds positive,abdomen soft and non-tender without masses, organomegaly or hernias noted. Pulses:  R and L posterior tibial pulses are full and equal bilaterally  Extremities:  no edema    Assessment and Plan: 1. HYPERLIPIDEMIA  Continue Zocor. Recheck labs today. Lipid Panel  2. HYPERTENSION  Stable on current meds. Comprehensive metabolic panel  3. DEGENERATIVE DISC DISEASE, LUMBAR SPINE  On pain contract.   4. OSTEOARTHRITIS, KNEE, LEFT, MILD

## 2012-08-06 NOTE — Patient Instructions (Addendum)
Good to see you. We will call you with your lab results.   

## 2012-08-07 LAB — COMPREHENSIVE METABOLIC PANEL
ALT: 56 U/L — ABNORMAL HIGH (ref 0–53)
BUN: 16 mg/dL (ref 6–23)
CO2: 24 mEq/L (ref 19–32)
Calcium: 9.7 mg/dL (ref 8.4–10.5)
Chloride: 103 mEq/L (ref 96–112)
Creatinine, Ser: 1.1 mg/dL (ref 0.4–1.5)
GFR: 77.02 mL/min (ref >60.00)

## 2012-08-07 LAB — LIPID PANEL
HDL: 32.6 mg/dL — ABNORMAL LOW (ref >39.00)
Triglycerides: 679 mg/dL — ABNORMAL HIGH (ref 0.0–149.0)

## 2012-08-21 ENCOUNTER — Other Ambulatory Visit: Payer: Self-pay | Admitting: Family Medicine

## 2012-08-23 ENCOUNTER — Emergency Department: Payer: Self-pay | Admitting: Emergency Medicine

## 2012-08-25 ENCOUNTER — Other Ambulatory Visit: Payer: Self-pay | Admitting: Family Medicine

## 2012-08-25 MED ORDER — ESCITALOPRAM OXALATE 20 MG PO TABS: 20.0000 mg | ORAL_TABLET | Freq: Every day | ORAL | Status: DC

## 2012-08-25 MED ORDER — RANOLAZINE ER 500 MG PO TB12: 500.0000 mg | ORAL_TABLET | Freq: Two times a day (BID) | ORAL | Status: DC

## 2012-08-25 MED ORDER — FENOFIBRATE 145 MG PO TABS: 145.0000 mg | ORAL_TABLET | Freq: Every day | ORAL | Status: DC

## 2012-08-25 MED ORDER — METOPROLOL TARTRATE 25 MG PO TABS: 25.0000 mg | ORAL_TABLET | Freq: Every day | ORAL | Status: DC

## 2012-08-25 MED ORDER — CLOPIDOGREL BISULFATE 75 MG PO TABS: 75.0000 mg | ORAL_TABLET | Freq: Every day | ORAL | Status: DC

## 2012-08-25 MED ORDER — CELECOXIB 200 MG PO CAPS: ORAL_CAPSULE | ORAL | Status: DC

## 2012-08-25 MED ORDER — AMLODIPINE BESYLATE 5 MG PO TABS: 5.0000 mg | ORAL_TABLET | Freq: Every day | ORAL | Status: DC

## 2012-08-25 MED ORDER — FEXOFENADINE HCL 180 MG PO TABS: 180.0000 mg | ORAL_TABLET | Freq: Every day | ORAL | Status: DC

## 2012-08-25 MED ORDER — MONTELUKAST SODIUM 10 MG PO TABS: ORAL_TABLET | ORAL | Status: DC

## 2012-08-25 NOTE — Telephone Encounter (Signed)
Pt needs refills on several meds.  Refills sent to [facility] mail order. Other refills awaiting Dr. Elmer Sow approval.

## 2012-08-25 NOTE — Telephone Encounter (Signed)
The pt's wife called hoping to get all of his medicines refilled through the [facility] mail order pharmacy.  She states she needs these medicines refilled because they are leaving for vacation soon.   Her callback number - [phone]   Thanks!

## 2012-08-28 ENCOUNTER — Telehealth: Payer: Self-pay | Admitting: *Deleted

## 2012-08-28 NOTE — Telephone Encounter (Signed)
Pt is asking if ok to get pneumovax and flu when he comes in for his labwork on Monday.  He also wants zostavax, but I told him he would need to wait a month after flu and pneumonia vaccines.

## 2012-08-28 NOTE — Telephone Encounter (Signed)
Advised [name], she will check with insurance company regarding coverage for zostavax.

## 2012-08-28 NOTE — Telephone Encounter (Signed)
Yes ok to receive them but zostavax likely will not be covered by his insurance since he is under 60.  He should check with his insurance to see if they will cover the shingles shot.

## 2012-09-01 ENCOUNTER — Other Ambulatory Visit: Payer: PRIVATE HEALTH INSURANCE

## 2012-09-04 ENCOUNTER — Other Ambulatory Visit (INDEPENDENT_AMBULATORY_CARE_PROVIDER_SITE_OTHER): Payer: PRIVATE HEALTH INSURANCE

## 2012-09-04 ENCOUNTER — Other Ambulatory Visit: Payer: Self-pay

## 2012-09-04 ENCOUNTER — Ambulatory Visit (INDEPENDENT_AMBULATORY_CARE_PROVIDER_SITE_OTHER): Payer: PRIVATE HEALTH INSURANCE

## 2012-09-04 DIAGNOSIS — Z23 Encounter for immunization: Secondary | ICD-10-CM

## 2012-09-04 DIAGNOSIS — E7800 Pure hypercholesterolemia, unspecified: Secondary | ICD-10-CM

## 2012-09-04 LAB — HEPATIC FUNCTION PANEL
ALT: 33 U/L (ref 0–53)
AST: 35 U/L (ref 0–37)
Albumin: 4.5 g/dL (ref 3.5–5.2)
Alkaline Phosphatase: 51 U/L (ref 39–117)

## 2012-09-04 LAB — LIPID PANEL
Cholesterol: 216 mg/dL — ABNORMAL HIGH (ref 0–200)
Triglycerides: 311 mg/dL — ABNORMAL HIGH (ref 0.0–149.0)

## 2012-09-04 NOTE — Telephone Encounter (Signed)
Request rx for oxycodone. Call when ready for pick up.

## 2012-09-05 ENCOUNTER — Other Ambulatory Visit: Payer: Self-pay | Admitting: *Deleted

## 2012-09-05 MED ORDER — OXYCODONE HCL 5 MG PO TABS: ORAL_TABLET | ORAL | Status: DC

## 2012-09-05 NOTE — Telephone Encounter (Signed)
Left message advising Bobby Pena script is ready for pick up.

## 2012-09-05 NOTE — Telephone Encounter (Signed)
Wife [name] was returning Laurie's call re pt's lab results. I notified her of results, but she also requested a refill of pt's oxycodone. Please call when ready to pick up.

## 2012-10-02 ENCOUNTER — Other Ambulatory Visit: Payer: Self-pay

## 2012-10-02 NOTE — Telephone Encounter (Signed)
Bobby Pena left v/m requesting rx oxycodone.call when ready for pick up.

## 2012-10-03 MED ORDER — OXYCODONE HCL 5 MG PO TABS: ORAL_TABLET | ORAL | Status: DC

## 2012-10-03 NOTE — Telephone Encounter (Signed)
Advised patient script is ready for pick up. 

## 2012-10-12 ENCOUNTER — Other Ambulatory Visit: Payer: Self-pay | Admitting: Family Medicine

## 2012-11-03 ENCOUNTER — Other Ambulatory Visit: Payer: Self-pay

## 2012-11-03 MED ORDER — OXYCODONE HCL 5 MG PO TABS: ORAL_TABLET | ORAL | Status: DC

## 2012-11-03 NOTE — Telephone Encounter (Signed)
Advised patient's wife script is ready for pick up at front desk.

## 2012-11-03 NOTE — Telephone Encounter (Signed)
[name] request rx oxycodone. Call when ready for pick up.

## 2012-11-06 ENCOUNTER — Other Ambulatory Visit: Payer: Self-pay | Admitting: *Deleted

## 2012-11-06 MED ORDER — CELECOXIB 200 MG PO CAPS: ORAL_CAPSULE | ORAL | Status: DC

## 2012-11-06 MED ORDER — FENOFIBRATE 145 MG PO TABS: 145.0000 mg | ORAL_TABLET | Freq: Every day | ORAL | Status: DC

## 2012-12-02 ENCOUNTER — Other Ambulatory Visit: Payer: Self-pay

## 2012-12-02 MED ORDER — OXYCODONE HCL 5 MG PO TABS: ORAL_TABLET | ORAL | Status: DC

## 2012-12-02 NOTE — Telephone Encounter (Signed)
Script printed, placed on doctor's desk for signature. 

## 2012-12-02 NOTE — Telephone Encounter (Signed)
Bobby Pena left v/m requesting rx oxycodone. Call when ready for pick up.

## 2012-12-03 NOTE — Telephone Encounter (Signed)
Left message advising patient script is ready for pick up. 

## 2012-12-19 ENCOUNTER — Other Ambulatory Visit: Payer: Self-pay | Admitting: *Deleted

## 2012-12-19 MED ORDER — MONTELUKAST SODIUM 10 MG PO TABS: ORAL_TABLET | ORAL | Status: DC

## 2012-12-19 MED ORDER — CELECOXIB 200 MG PO CAPS: ORAL_CAPSULE | ORAL | Status: DC

## 2012-12-19 MED ORDER — ROSUVASTATIN CALCIUM 40 MG PO TABS: ORAL_TABLET | ORAL | Status: DC

## 2012-12-19 MED ORDER — AMLODIPINE BESYLATE 5 MG PO TABS: 5.0000 mg | ORAL_TABLET | Freq: Every day | ORAL | Status: DC

## 2012-12-19 MED ORDER — CLOPIDOGREL BISULFATE 75 MG PO TABS: 75.0000 mg | ORAL_TABLET | Freq: Every day | ORAL | Status: DC

## 2012-12-19 MED ORDER — FENOFIBRATE 145 MG PO TABS: 145.0000 mg | ORAL_TABLET | Freq: Every day | ORAL | Status: DC

## 2012-12-19 MED ORDER — RANOLAZINE ER 500 MG PO TB12: 500.0000 mg | ORAL_TABLET | Freq: Two times a day (BID) | ORAL | Status: DC

## 2012-12-19 MED ORDER — METOPROLOL TARTRATE 25 MG PO TABS: 25.0000 mg | ORAL_TABLET | Freq: Every day | ORAL | Status: DC

## 2012-12-19 MED ORDER — ESCITALOPRAM OXALATE 20 MG PO TABS: 20.0000 mg | ORAL_TABLET | Freq: Every day | ORAL | Status: DC

## 2012-12-19 NOTE — Telephone Encounter (Signed)
Requesting rx for new mail order pharmacy

## 2012-12-31 ENCOUNTER — Other Ambulatory Visit: Payer: Self-pay

## 2012-12-31 MED ORDER — OXYCODONE HCL 5 MG PO TABS: ORAL_TABLET | ORAL | Status: DC

## 2012-12-31 NOTE — Telephone Encounter (Signed)
Lady left v/m requesting rx oxycodone. Call when ready for pick up.

## 2012-12-31 NOTE — Telephone Encounter (Signed)
Left message on wife's voice mail advising script is ready for pick up.  Script placed at front desk.

## 2013-01-27 ENCOUNTER — Other Ambulatory Visit: Payer: Self-pay | Admitting: Family Medicine

## 2013-01-27 MED ORDER — OXYCODONE HCL 5 MG PO TABS: ORAL_TABLET | ORAL | Status: DC

## 2013-01-27 NOTE — Telephone Encounter (Signed)
Pt request Oxycodone refill.  Last filled 12/31/12.  Please call when ready for pick-up.

## 2013-01-28 MED ORDER — OXYCODONE HCL 5 MG PO TABS: ORAL_TABLET | ORAL | Status: DC

## 2013-01-28 NOTE — Telephone Encounter (Signed)
RX reprinted.

## 2013-01-28 NOTE — Telephone Encounter (Signed)
Informed pt that RX ready for pick up in front office.

## 2013-02-23 ENCOUNTER — Other Ambulatory Visit: Payer: Self-pay

## 2013-02-23 MED ORDER — OXYCODONE HCL 5 MG PO TABS: ORAL_TABLET | ORAL | Status: DC

## 2013-02-23 NOTE — Telephone Encounter (Signed)
Left message on patient's voice mail advising him that script is ready for pick up.  Script placed at front desk.

## 2013-02-23 NOTE — Telephone Encounter (Signed)
Lady left v/m requesting rx oxycodone. Call when ready for pick up.

## 2013-03-23 ENCOUNTER — Other Ambulatory Visit: Payer: Self-pay

## 2013-03-23 MED ORDER — OXYCODONE HCL 5 MG PO TABS: ORAL_TABLET | ORAL | Status: DC

## 2013-03-23 NOTE — Telephone Encounter (Signed)
Left message advising wife that script is ready for pick up.

## 2013-03-23 NOTE — Telephone Encounter (Signed)
Bobby Pena left v/m requesting rx oxycodone. Call when ready for pick up. Pt is in [place] and his Diplomatic Services operational officer, Mayford Knife will pick up rx.

## 2013-04-02 ENCOUNTER — Other Ambulatory Visit: Payer: Self-pay | Admitting: *Deleted

## 2013-04-02 MED ORDER — CELECOXIB 200 MG PO CAPS: ORAL_CAPSULE | ORAL | Status: DC

## 2013-04-21 ENCOUNTER — Other Ambulatory Visit: Payer: Self-pay

## 2013-04-21 MED ORDER — OXYCODONE HCL 5 MG PO TABS: ORAL_TABLET | ORAL | Status: DC

## 2013-04-21 NOTE — Telephone Encounter (Signed)
Advised patient's wife, script placed at front desk. Myriam Jacobson, employee will pick up script today.

## 2013-04-21 NOTE — Telephone Encounter (Signed)
Bobby Pena left v/m requesting rx oxycodone. Call when ready for pick up.

## 2013-04-21 NOTE — Telephone Encounter (Signed)
Ok to refill one time only.  Needs to be seen for additional refills.

## 2013-05-12 ENCOUNTER — Encounter: Payer: Self-pay | Admitting: *Deleted

## 2013-05-13 ENCOUNTER — Encounter: Payer: Self-pay | Admitting: Family Medicine

## 2013-05-13 ENCOUNTER — Ambulatory Visit (INDEPENDENT_AMBULATORY_CARE_PROVIDER_SITE_OTHER): Payer: BC Managed Care – PPO | Admitting: Family Medicine

## 2013-05-13 VITALS — BP 130/82 | HR 74 | Temp 97.7°F | Wt 313.0 lb

## 2013-05-13 DIAGNOSIS — Z125 Encounter for screening for malignant neoplasm of prostate: Secondary | ICD-10-CM

## 2013-05-13 DIAGNOSIS — I10 Essential (primary) hypertension: Secondary | ICD-10-CM

## 2013-05-13 DIAGNOSIS — E785 Hyperlipidemia, unspecified: Secondary | ICD-10-CM

## 2013-05-13 DIAGNOSIS — F17200 Nicotine dependence, unspecified, uncomplicated: Secondary | ICD-10-CM

## 2013-05-13 LAB — COMPREHENSIVE METABOLIC PANEL
BUN: 17 mg/dL (ref 6–23)
CO2: 27 mEq/L (ref 19–32)
Calcium: 8.8 mg/dL (ref 8.4–10.5)
Chloride: 100 mEq/L (ref 96–112)
Creatinine, Ser: 0.9 mg/dL (ref 0.4–1.5)
GFR: 95.21 mL/min (ref >60.00)
Glucose, Bld: 120 mg/dL — ABNORMAL HIGH (ref 70–99)
Total Bilirubin: 0.6 mg/dL (ref 0.3–1.2)

## 2013-05-13 LAB — LIPID PANEL
Cholesterol: 233 mg/dL — ABNORMAL HIGH (ref 0–200)
HDL: 40.8 mg/dL (ref >39.00)
Triglycerides: 430 mg/dL — ABNORMAL HIGH (ref 0.0–149.0)
VLDL: 86 mg/dL — ABNORMAL HIGH (ref 0.0–40.0)

## 2013-05-13 LAB — LDL CHOLESTEROL, DIRECT: Direct LDL: 142 mg/dL

## 2013-05-13 NOTE — Patient Instructions (Addendum)
Good to see you. We will call you with your lab results.   

## 2013-05-13 NOTE — Progress Notes (Signed)
57 yo here for follow up chronic medical issues.  Still working in [place] for several months out of the year.  HLD- On zocor 40 mg daily.  Denies any myalgias.  Working on cutting back on fried foods.  Has gained a few pounds since last year.  Lab Results  Component Value Date   CHOL 216* 09/04/2012   HDL 38.40* 09/04/2012   LDLDIRECT 131.2 09/04/2012   TRIG 311.0* 09/04/2012   CHOLHDL 6 09/04/2012     HTN- On amlodipine and lopressor.  Denies any HA, blurred vision or SOB. No CP.   Chronic knee and back pain- taking oxycodone 5- 10 mg every 8 hours.  No h/o breaking pain contract.  Tobacco abuse- has cut back to 1ppd from 3 ppd.  Not quite ready to quit.  Patient Active Problem List   Diagnosis Date Noted  . GERD 01/11/2011  . FATIGUE 01/08/2011  . KNEE PAIN, LEFT 01/01/2011  . 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 BACK PAIN 12/21/2010  . CAD 11/07/2010  . OTHER TESTICULAR HYPOFUNCTION 10/30/2010  . SHORTNESS OF BREATH 07/27/2010  . TOBACCO ABUSE 06/23/2010  . SLEEP APNEA 06/23/2010  . COLONIC POLYPS, HX OF 06/23/2010  . HYPERLIPIDEMIA 05/16/2010  . DEPRESSION 05/16/2010  . OBSTRUCTIVE SLEEP APNEA 05/16/2010  . HYPERTENSION 05/16/2010  . MYOCARDIAL INFARCTION, HX OF 05/16/2010   Past Medical History  Diagnosis Date  . CAD (coronary artery disease)      a. h/o stent to the OM;  b. cath in 2004: OM stent patent, no obstructive CAD, EF 60%;    c.  h/o cath in [place];       d.  myoview 07/2010: EF 55%; inf. scar vs. artifact, no ischemia; low risk study  . Hypertension   . Hyperlipidemia   . COPD (chronic obstructive pulmonary disease)   . GERD (gastroesophageal reflux disease)   . Obstructive sleep apnea   . Depression   . Colon polyps   . Degenerative disc disease, lumbar   . Degenerative joint disease     Left knee   No past surgical history on file. History  Substance Use Topics  . Smoking status: Current Every Day Smoker -- 1.00  packs/day for 38 years  . Smokeless tobacco: Not on file  . Alcohol Use: Yes     Comment: weekly, Vodka   Family History  Problem Relation Age of Onset  . Lung cancer Mother   . Heart attack Maternal Uncle    Allergies  Allergen Reactions  . Varenicline Tartrate    Current Outpatient Prescriptions on File Prior to Visit  Medication Sig Dispense Refill  . amLODipine (NORVASC) 5 MG tablet Take 1 tablet (5 mg total) by mouth daily.  90 tablet  1  . aspirin 325 MG tablet Take 325 mg by mouth daily.        . celecoxib (CELEBREX) 200 MG capsule TAKE ONE CAPSULE BY MOUTH DAILY  90 capsule  0  . clopidogrel (PLAVIX) 75 MG tablet Take 1 tablet (75 mg total) by mouth daily.  90 tablet  2  . escitalopram (LEXAPRO) 20 MG tablet Take 1 tablet (20 mg total) by mouth daily.  90 tablet  2  . fenofibrate (TRICOR) 145 MG tablet Take 1 tablet (145 mg total) by mouth daily.  90 tablet  1  . fexofenadine (ALLEGRA) 180 MG tablet Take 1 tablet (180 mg total) by mouth daily.  90 tablet  3  . metoprolol tartrate (LOPRESSOR)  25 MG tablet Take 1 tablet (25 mg total) by mouth daily.  90 tablet  1  . montelukast (SINGULAIR) 10 MG tablet Take one by mouth daily  90 tablet  1  . oxyCODONE (OXY IR/ROXICODONE) 5 MG immediate release tablet 1-2 tablets every 8 hours as needed for pain  180 tablet  0  . ranolazine (RANEXA) 500 MG 12 hr tablet Take 1 tablet (500 mg total) by mouth 2 (two) times daily.  180 tablet  1  . rosuvastatin (CRESTOR) 40 MG tablet Take one tablet by mouth daily  90 tablet  3  . rosuvastatin (CRESTOR) 40 MG tablet Take one by mouth daily.  90 tablet  1   No current facility-administered medications on file prior to visit.     The PMH, PSH, Social History, Family History, Medications, and allergies have been reviewed in [facility], and have been updated if relevant.  REVIEW OF SYSTEMS See HPI Patient reports no  vision/ hearing changes,anorexia, weight change, fever ,adenopathy, persistant /  recurrent hoarseness, swallowing issues, chest pain, edema,persistant / recurrent cough, hemoptysis, dyspnea(rest, exertional, paroxysmal nocturnal), gastrointestinal  bleeding (melena, rectal bleeding), abdominal pain, excessive heart burn, GU symptoms(dysuria, hematuria, pyuria, voiding/incontinence  Issues) syncope, focal weakness, severe memory loss, concerning skin lesions, depression, anxiety, abnormal bruising/bleeding, major joint swelling.    PHYSICAL EXAM: BP 130/82  Pulse 74  Temp(Src) 97.7 F (36.5 C)  Wt 313 lb (141.976 kg)  BMI 37.11 kg/m2 Wt Readings from Last 3 Encounters:  05/13/13 313 lb (141.976 kg)  08/06/12 304 lb (137.893 kg)  01/18/12 300 lb (136.079 kg)    General:  overweght male in NAD Eyes:  PERRL Ears:  External ear exam shows no significant lesions or deformities.  Otoscopic examination reveals clear canals, tympanic membranes are intact bilaterally without bulging, retraction, inflammation or discharge. Hearing is grossly normal bilaterally. Nose:  External nasal examination shows no deformity or inflammation. Nasal mucosa are pink and moist without lesions or exudates. Mouth:  Oral mucosa and oropharynx without lesions or exudates.  Teeth in good repair. Neck:  no carotid bruit or thyromegaly no cervical or supraclavicular lymphadenopathy  Lungs:  Normal respiratory effort, chest expands symmetrically. Lungs are clear to auscultation, no crackles or wheezes. Heart:  Normal rate and regular rhythm. S1 and S2 normal without gallop, murmur, click, rub or other extra sounds. Abdomen:  Bowel sounds positive,abdomen soft and non-tender without masses, organomegaly or hernias noted. Pulses:  R and L posterior tibial pulses are full and equal bilaterally  Extremities:  no edema    Assessment and Plan:  1. HYPERLIPIDEMIA Continue current dose of zocor.  Recheck labs today. - Comprehensive metabolic panel - Lipid Panel  2. HYPERTENSION Stable on current  meds.  3. TOBACCO ABUSE Improved but not quite ready to quit.  4. Screening for prostate cancer  - PSA

## 2013-05-20 ENCOUNTER — Other Ambulatory Visit: Payer: Self-pay

## 2013-05-20 MED ORDER — OXYCODONE HCL 5 MG PO TABS: ORAL_TABLET | ORAL | Status: DC

## 2013-05-20 NOTE — Telephone Encounter (Signed)
Candice request rx oxycodone. Pt will be flying out of [facility] Friday morning and needs pick up rx on 05/21/13. Call when ready for pick up. (Dr Dayton Martes not in office until 05/21/13 afternoon).

## 2013-05-22 ENCOUNTER — Encounter: Payer: Self-pay | Admitting: Family Medicine

## 2013-06-15 ENCOUNTER — Other Ambulatory Visit: Payer: Self-pay

## 2013-06-15 MED ORDER — OXYCODONE HCL 5 MG PO TABS: ORAL_TABLET | ORAL | Status: DC

## 2013-06-15 NOTE — Telephone Encounter (Signed)
Advised patient's wife script is ready for pick up, script placed at front desk.

## 2013-06-15 NOTE — Telephone Encounter (Signed)
Candice request rx oxycodone. Call when ready for pick up. Pt's secretary, Myriam Jacobson will pick up rx.

## 2013-06-30 ENCOUNTER — Telehealth: Payer: Self-pay | Admitting: Family Medicine

## 2013-06-30 NOTE — Telephone Encounter (Signed)
No because he is taking Lexapro and there could be an interaction.

## 2013-06-30 NOTE — Telephone Encounter (Signed)
Pt's wife left vm to state she and pt have seen advertisements for Belviq and want to know if this would be an option for pt to take for weight loss.

## 2013-06-30 NOTE — Telephone Encounter (Signed)
Advised patient's wife as instructed. 

## 2013-07-13 ENCOUNTER — Other Ambulatory Visit: Payer: Self-pay | Admitting: *Deleted

## 2013-07-13 ENCOUNTER — Other Ambulatory Visit: Payer: Self-pay

## 2013-07-13 MED ORDER — MONTELUKAST SODIUM 10 MG PO TABS: ORAL_TABLET | ORAL | Status: DC

## 2013-07-13 MED ORDER — OXYCODONE HCL 5 MG PO TABS: ORAL_TABLET | ORAL | Status: DC

## 2013-07-13 MED ORDER — FENOFIBRATE 145 MG PO TABS: 145.0000 mg | ORAL_TABLET | Freq: Every day | ORAL | Status: DC

## 2013-07-13 MED ORDER — METOPROLOL TARTRATE 25 MG PO TABS: 25.0000 mg | ORAL_TABLET | Freq: Every day | ORAL | Status: DC

## 2013-07-13 MED ORDER — CELECOXIB 200 MG PO CAPS: ORAL_CAPSULE | ORAL | Status: DC

## 2013-07-13 NOTE — Telephone Encounter (Signed)
[name] left v/m requesting rx oxycodone. Call when ready for pick up; pts secretary Myriam Jacobson will pick up rx.

## 2013-07-13 NOTE — Telephone Encounter (Signed)
Advised patient script is ready to pick up, placed at front desk. 

## 2013-08-06 ENCOUNTER — Other Ambulatory Visit: Payer: Self-pay

## 2013-08-06 MED ORDER — OXYCODONE HCL 5 MG PO TABS: ORAL_TABLET | ORAL | Status: DC

## 2013-08-06 NOTE — Telephone Encounter (Signed)
[name] left v/m requesting rx oxycodone. Call when ready for pick up. 

## 2013-08-07 MED ORDER — OXYCODONE HCL 5 MG PO TABS: ORAL_TABLET | ORAL | Status: DC

## 2013-08-07 NOTE — Telephone Encounter (Signed)
Left message on voice mail advising wife script is ready for pick up.

## 2013-08-07 NOTE — Telephone Encounter (Signed)
Wasn't this just refilled?

## 2013-08-07 NOTE — Telephone Encounter (Signed)
The last refill, prior to this one, was 8/18.  Request came in yesterday afternoon.

## 2013-08-07 NOTE — Telephone Encounter (Signed)
Ok to refill 

## 2013-08-13 ENCOUNTER — Other Ambulatory Visit: Payer: BC Managed Care – PPO

## 2013-08-28 ENCOUNTER — Encounter: Payer: Self-pay | Admitting: Gastroenterology

## 2013-09-02 ENCOUNTER — Other Ambulatory Visit: Payer: Self-pay | Admitting: *Deleted

## 2013-09-02 MED ORDER — CLOPIDOGREL BISULFATE 75 MG PO TABS: 75.0000 mg | ORAL_TABLET | Freq: Every day | ORAL | Status: DC

## 2013-09-02 MED ORDER — ESCITALOPRAM OXALATE 20 MG PO TABS: 20.0000 mg | ORAL_TABLET | Freq: Every day | ORAL | Status: AC

## 2013-09-03 ENCOUNTER — Other Ambulatory Visit: Payer: Self-pay

## 2013-09-03 MED ORDER — OXYCODONE HCL 5 MG PO TABS: ORAL_TABLET | ORAL | Status: DC

## 2013-09-03 NOTE — Telephone Encounter (Signed)
[name] left v/m requesting rx oxycodone. Call when ready for pick up. 

## 2013-09-03 NOTE — Telephone Encounter (Signed)
RX Placed on Dr. Elmer Sow desk for signature.

## 2013-09-04 NOTE — Telephone Encounter (Signed)
RX placed at front desk and pt notified.

## 2013-10-02 ENCOUNTER — Other Ambulatory Visit: Payer: Self-pay

## 2013-10-02 MED ORDER — OXYCODONE HCL 5 MG PO TABS: ORAL_TABLET | ORAL | Status: DC

## 2013-10-02 NOTE — Telephone Encounter (Signed)
Bobby Pena request rx oxycodone. Call when ready for pick up.

## 2013-10-02 NOTE — Telephone Encounter (Signed)
Informed pt that RX ready to be picked up at front desk. 

## 2013-10-30 ENCOUNTER — Other Ambulatory Visit: Payer: Self-pay | Admitting: *Deleted

## 2013-10-30 MED ORDER — RANOLAZINE ER 500 MG PO TB12: 500.0000 mg | ORAL_TABLET | Freq: Two times a day (BID) | ORAL | Status: AC

## 2013-10-30 MED ORDER — ROSUVASTATIN CALCIUM 40 MG PO TABS: ORAL_TABLET | ORAL | Status: DC

## 2013-11-02 ENCOUNTER — Other Ambulatory Visit: Payer: Self-pay | Admitting: *Deleted

## 2013-11-02 MED ORDER — OXYCODONE HCL 5 MG PO TABS: ORAL_TABLET | ORAL | Status: DC

## 2013-11-02 NOTE — Telephone Encounter (Signed)
RX printed, signed and ready for pickup

## 2013-11-02 NOTE — Telephone Encounter (Signed)
Please call when ready, rx will be picked up by Sharlyn Bologna

## 2013-11-03 NOTE — Telephone Encounter (Signed)
[name] notified rx at front desk and ready for pick up.

## 2013-11-30 ENCOUNTER — Other Ambulatory Visit: Payer: Self-pay

## 2013-11-30 MED ORDER — OXYCODONE HCL 5 MG PO TABS: ORAL_TABLET | ORAL | Status: DC

## 2013-11-30 NOTE — Telephone Encounter (Signed)
Spoke to candace and informed her that pt's Rx is available at the front desk for pick up;informed govt issued photo id is required for pick up

## 2013-11-30 NOTE — Telephone Encounter (Signed)
Bobby Pena left v/m requesting rx oxycodone; call when ready for pick up.

## 2014-01-04 ENCOUNTER — Other Ambulatory Visit: Payer: Self-pay

## 2014-01-04 MED ORDER — OXYCODONE HCL 5 MG PO TABS: ORAL_TABLET | ORAL | Status: DC

## 2014-01-04 NOTE — Telephone Encounter (Signed)
Spoke to Kep'el and informed her that pts Rx is available for pickup. Pt informed that he will have to submit a drug screen in order to receive Rx. Pt informed a gov't issued photo id required for pickup. Pt states that he is in [place] and has just completed neck surgery. Pt requesting test be waived, and per Dr Deborra Medina, it cannot be. Pt MUST submit urine sample and renew contract before he can receive Rx for controlled substance. Pt very upset and d/c telephone call.

## 2014-01-04 NOTE — Telephone Encounter (Signed)
Bobby Pena left v/m requesting rx oxycodone. Call when ready for pick up.

## 2014-01-19 ENCOUNTER — Other Ambulatory Visit: Payer: Self-pay | Admitting: Family Medicine

## 2014-03-14 ENCOUNTER — Other Ambulatory Visit: Payer: Self-pay | Admitting: Family Medicine

## 2014-04-24 ENCOUNTER — Encounter: Payer: Self-pay | Admitting: Gastroenterology

## 2014-06-30 ENCOUNTER — Encounter: Payer: Self-pay | Admitting: Family Medicine

## 2014-06-30 ENCOUNTER — Ambulatory Visit (INDEPENDENT_AMBULATORY_CARE_PROVIDER_SITE_OTHER): Payer: BC Managed Care – PPO | Admitting: Family Medicine

## 2014-06-30 ENCOUNTER — Telehealth: Payer: Self-pay | Admitting: Family Medicine

## 2014-06-30 VITALS — BP 142/92 | HR 72 | Temp 98.2°F | Ht 73.5 in | Wt 313.5 lb

## 2014-06-30 DIAGNOSIS — I2510 Atherosclerotic heart disease of native coronary artery without angina pectoris: Secondary | ICD-10-CM

## 2014-06-30 DIAGNOSIS — M5137 Other intervertebral disc degeneration, lumbosacral region: Secondary | ICD-10-CM

## 2014-06-30 DIAGNOSIS — F17200 Nicotine dependence, unspecified, uncomplicated: Secondary | ICD-10-CM

## 2014-06-30 DIAGNOSIS — E785 Hyperlipidemia, unspecified: Secondary | ICD-10-CM

## 2014-06-30 LAB — COMPREHENSIVE METABOLIC PANEL
ALBUMIN: 4.3 g/dL (ref 3.5–5.2)
ALT: 26 U/L (ref 0–53)
AST: 34 U/L (ref 0–37)
Alkaline Phosphatase: 50 U/L (ref 39–117)
BUN: 18 mg/dL (ref 6–23)
CO2: 27 mEq/L (ref 19–32)
CREATININE: 1.2 mg/dL (ref 0.4–1.5)
Calcium: 9.4 mg/dL (ref 8.4–10.5)
Chloride: 105 mEq/L (ref 96–112)
GFR: 66.94 mL/min (ref >60.00)
Glucose, Bld: 81 mg/dL (ref 70–99)
Potassium: 4.3 mEq/L (ref 3.5–5.1)
Sodium: 140 mEq/L (ref 135–145)
Total Bilirubin: 0.5 mg/dL (ref 0.2–1.2)
Total Protein: 7.5 g/dL (ref 6.0–8.3)

## 2014-06-30 LAB — LDL CHOLESTEROL, DIRECT: Direct LDL: 118.1 mg/dL

## 2014-06-30 LAB — LIPID PANEL
CHOL/HDL RATIO: 5
Cholesterol: 187 mg/dL (ref 0–200)
HDL: 39.7 mg/dL (ref >39.00)
NonHDL: 147.3
TRIGLYCERIDES: 313 mg/dL — AB (ref 0.0–149.0)
VLDL: 62.6 mg/dL — ABNORMAL HIGH (ref 0.0–40.0)

## 2014-06-30 NOTE — Patient Instructions (Signed)
Good to see you. We will call you with your lab results.  Call your pain clinic today in [place].

## 2014-06-30 NOTE — Assessment & Plan Note (Signed)
>  15 minutes spent in face to face time with patient, >50% spent in counselling or coordination of care.  He mainly came to the office to see me to get his narcotics refilled.  I explained I simply cannot do that since he is followed by a pain clinic in [place].  He will call his pain clinic when he leaves our office today.

## 2014-06-30 NOTE — Assessment & Plan Note (Signed)
Continue current rx and will check labs today. Will request records from cardiologist but I did suggest he transfer care to [place] since he is there most of the year. Orders Placed This Encounter  Procedures  . Lipid panel  . Comprehensive metabolic panel

## 2014-06-30 NOTE — Progress Notes (Signed)
Pre visit review using our clinic review tool, if applicable. No additional management support is needed unless otherwise documented below in the visit note. 

## 2014-06-30 NOTE — Telephone Encounter (Signed)
Relevant patient education assigned to patient using Emmi. ° °

## 2014-06-30 NOTE — Progress Notes (Signed)
58 yo here for follow up chronic medical issues. I have not seen him in over a year.  Still working in [place] for 7-8 months out of the year.  "All of my medications were changed in [place]."  HLD- Now taking Crestor 40 mg daily and Vascepa.   Denies any myalgias.      Lab Results  Component Value Date   CHOL 233* 05/13/2013   HDL 40.80 05/13/2013   LDLDIRECT 142.0 05/13/2013   TRIG 430.0* 05/13/2013   CHOLHDL 6 05/13/2013   Wt Readings from Last 3 Encounters:  06/30/14 313 lb 8 oz (142.203 kg)  05/13/13 313 lb (141.976 kg)  08/06/12 304 lb (137.893 kg)     HTN- On amlodipine and lopressor.  Denies any HA, blurred vision or SOB. No CP.  Chronic knee and back pain- had neck surgery on neck 6 months ago.   Taking oxycodone 10 mg every 8 hours.  No h/o breaking pain contract- going to a pain clinic in [place].  He wants me to refill his oxycodone.  Tobacco abuse- has cut back to 1ppd from 3 ppd.  Not quite ready to quit.  CAD- s/p DES, seeing cardiologist in [place]- Dr. Chestine Spore.  On ASA, plavix and Ranexa.  Patient Active Problem List   Diagnosis Date Noted  . GERD 01/11/2011  . [place] DISEASE, LUMBAR SPINE 12/21/2010  . BACK PAIN 12/21/2010  . CAD 11/07/2010  . OTHER TESTICULAR HYPOFUNCTION 10/30/2010  . TOBACCO ABUSE 06/23/2010  . SLEEP APNEA 06/23/2010  . COLONIC POLYPS, HX OF 06/23/2010  . HYPERLIPIDEMIA 05/16/2010  . DEPRESSION 05/16/2010  . OBSTRUCTIVE SLEEP APNEA 05/16/2010  . HYPERTENSION 05/16/2010  . MYOCARDIAL INFARCTION, HX OF 05/16/2010   Past Medical History  Diagnosis Date  . CAD (coronary artery disease)      a. h/o stent to the OM;  b. cath in 2004: OM stent patent, no obstructive CAD, EF 60%;    c.  h/o cath in [place];       d.  myoview 07/2010: EF 55%; inf. scar vs. artifact, no ischemia; low risk study  . Hypertension   . Hyperlipidemia   . COPD (chronic obstructive pulmonary disease)   . GERD (gastroesophageal reflux disease)   .  Obstructive sleep apnea   . Depression   . Colon polyps   . Degenerative disc disease, lumbar   . Degenerative joint disease     Left knee   No past surgical history on file. History  Substance Use Topics  . Smoking status: Current Every Day Smoker -- 1.00 packs/day for 38 years  . Smokeless tobacco: Not on file  . Alcohol Use: Yes     Comment: weekly, Vodka   Family History  Problem Relation Age of Onset  . Lung cancer Mother   . Heart attack Maternal Uncle    Allergies  Allergen Reactions  . Varenicline Tartrate    Current Outpatient Prescriptions on File Prior to Visit  Medication Sig Dispense Refill  . amLODipine (NORVASC) 5 MG tablet TAKE 1 BY MOUTH DAILY  90 tablet  0  . aspirin 325 MG tablet Take 325 mg by mouth daily.        . CELEBREX 200 MG capsule TAKE 1 BY MOUTH DAILY  90 capsule  0  . clopidogrel (PLAVIX) 75 MG tablet TAKE 1 BY MOUTH DAILY  90 tablet  0  . CRESTOR 40 MG tablet TAKE 1 BY MOUTH DAILY  30 tablet  0  .  escitalopram (LEXAPRO) 20 MG tablet Take 1 tablet (20 mg total) by mouth daily.  90 tablet  0  . metoprolol tartrate (LOPRESSOR) 25 MG tablet TAKE 1 BY MOUTH DAILY  90 tablet  0  . ranolazine (RANEXA) 500 MG 12 hr tablet Take 1 tablet (500 mg total) by mouth 2 (two) times daily.  180 tablet  1   No current facility-administered medications on file prior to visit.     The PMH, PSH, Social History, Family History, Medications, and allergies have been reviewed in [facility], and have been updated if relevant.  REVIEW OF SYSTEMS See HPI     PHYSICAL EXAM: BP 142/92  Pulse 72  Temp(Src) 98.2 F (36.8 C) (Oral)  Ht 6' 1.5" (1.867 m)  Wt 313 lb 8 oz (142.203 kg)  BMI 40.80 kg/m2  SpO2 96% Wt Readings from Last 3 Encounters:  06/30/14 313 lb 8 oz (142.203 kg)  05/13/13 313 lb (141.976 kg)  08/06/12 304 lb (137.893 kg)    General:  overweght male in NAD Eyes:  PERRL Ears:  External ear exam shows no significant lesions or deformities.  Otoscopic  examination reveals clear canals, tympanic membranes are intact bilaterally without bulging, retraction, inflammation or discharge. Hearing is grossly normal bilaterally. Nose:  External nasal examination shows no deformity or inflammation. Nasal mucosa are pink and moist without lesions or exudates. Mouth:  Oral mucosa and oropharynx without lesions or exudates.  Teeth in good repair. Neck:  no carotid bruit or thyromegaly no cervical or supraclavicular lymphadenopathy  Lungs:  Normal respiratory effort, chest expands symmetrically. Lungs are clear to auscultation, no crackles or wheezes. Heart:  Normal rate and regular rhythm. S1 and S2 normal without gallop, murmur, click, rub or other extra sounds. Abdomen:  Bowel sounds positive,abdomen soft and non-tender without masses, organomegaly or hernias noted. Pulses:  R and L posterior tibial pulses are full and equal bilaterally  Extremities:  no edema

## 2014-07-12 ENCOUNTER — Telehealth: Payer: Self-pay

## 2014-07-12 DIAGNOSIS — L989 Disorder of the skin and subcutaneous tissue, unspecified: Secondary | ICD-10-CM

## 2014-07-12 NOTE — Telephone Encounter (Signed)
Spoke to pt who states that he has blackheads, and knots on his back. He believes that these will need to be lanced, and is requesting referral. Pt is wanting to know if he needs an additional OV as he did not mention or show them to you at his last visit.

## 2014-07-12 NOTE — Telephone Encounter (Signed)
Bobby Pena left v/m requesting dermatology referral; pt having skin issue on back;unable to reach pt or Bobby Pena for more details.Please advise.

## 2014-07-12 NOTE — Telephone Encounter (Signed)
Please call pt to get more information. 

## 2014-07-12 NOTE — Telephone Encounter (Signed)
Lm on pts vm requesting a call back 

## 2014-07-13 NOTE — Telephone Encounter (Signed)
Referral placed.

## 2014-07-13 NOTE — Addendum Note (Signed)
Addended by: Lucille Passy on: 07/13/2014 07:03 AM   Modules accepted: Orders

## 2016-02-27 ENCOUNTER — Telehealth: Payer: Self-pay | Admitting: Family Medicine

## 2016-02-27 NOTE — Telephone Encounter (Signed)
I am so sorry to hear about his loss.

## 2016-02-27 NOTE — Telephone Encounter (Signed)
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

## 2019-04-06 ENCOUNTER — Telehealth: Payer: Self-pay | Admitting: Family Medicine

## 2019-04-06 NOTE — Telephone Encounter (Signed)
Called patient and no vm. Calling to verify PCP. Patient has not been seen by Dr. Deborra Medina since 2015
